# Patient Record
Sex: Male | Born: 1935 | Hispanic: Yes | Smoking: Never smoker
Health system: Southern US, Community
[De-identification: ages and names within clinical notes are randomized; demographics above are authoritative.]

## PROBLEM LIST (undated history)

## (undated) DIAGNOSIS — I10 Essential (primary) hypertension: Secondary | ICD-10-CM

## (undated) DIAGNOSIS — E079 Disorder of thyroid, unspecified: Secondary | ICD-10-CM

## (undated) HISTORY — DX: Disorder of thyroid, unspecified: E07.9

---

## 2009-04-09 ENCOUNTER — Emergency Department: Payer: Self-pay | Admitting: Emergency Medicine

## 2011-09-05 ENCOUNTER — Emergency Department: Payer: Self-pay | Admitting: Emergency Medicine

## 2014-06-30 ENCOUNTER — Observation Stay: Payer: Self-pay | Admitting: Internal Medicine

## 2014-06-30 LAB — CBC
HCT: 42.9 % (ref 40.0–52.0)
HGB: 14.1 g/dL (ref 13.0–18.0)
MCH: 29 pg (ref 26.0–34.0)
MCHC: 32.9 g/dL (ref 32.0–36.0)
MCV: 88 fL (ref 80–100)
Platelet: 243 10*3/uL (ref 150–440)
RBC: 4.86 10*6/uL (ref 4.40–5.90)
RDW: 15.2 % — ABNORMAL HIGH (ref 11.5–14.5)
WBC: 10.2 10*3/uL (ref 3.8–10.6)

## 2014-06-30 LAB — TROPONIN I
Troponin-I: <0.02 ng/mL
Troponin-I: <0.02 ng/mL
Troponin-I: <0.02 ng/mL

## 2014-06-30 LAB — COMPREHENSIVE METABOLIC PANEL
ALT: 24 U/L
AST: 27 U/L (ref 15–37)
Albumin: 3.4 g/dL (ref 3.4–5.0)
Alkaline Phosphatase: 82 U/L
Anion Gap: 9 (ref 7–16)
BILIRUBIN TOTAL: 1.2 mg/dL — AB (ref 0.2–1.0)
BUN: 19 mg/dL — ABNORMAL HIGH (ref 7–18)
CHLORIDE: 100 mmol/L (ref 98–107)
CO2: 25 mmol/L (ref 21–32)
Calcium, Total: 8.4 mg/dL — ABNORMAL LOW (ref 8.5–10.1)
Creatinine: 1.17 mg/dL (ref 0.60–1.30)
EGFR (African American): >60
GFR CALC NON AF AMER: 56 — AB
GLUCOSE: 89 mg/dL (ref 65–99)
Osmolality: 270 (ref 275–301)
POTASSIUM: 4.2 mmol/L (ref 3.5–5.1)
Sodium: 134 mmol/L — ABNORMAL LOW (ref 136–145)
Total Protein: 6.9 g/dL (ref 6.4–8.2)

## 2014-06-30 LAB — LIPID PANEL
Cholesterol: 120 mg/dL (ref 0–200)
HDL Cholesterol: 42 mg/dL (ref 40–60)
Ldl Cholesterol, Calc: 61 mg/dL (ref 0–100)
Triglycerides: 87 mg/dL (ref 0–200)
VLDL Cholesterol, Calc: 17 mg/dL (ref 5–40)

## 2014-06-30 LAB — CK TOTAL AND CKMB (NOT AT ARMC)
CK, Total: 80 U/L
CK, Total: 56 U/L
CK, Total: 55 U/L
CK-MB: 1.1 ng/mL (ref 0.5–3.6)
CK-MB: 1.2 ng/mL (ref 0.5–3.6)
CK-MB: 1 ng/mL (ref 0.5–3.6)

## 2014-06-30 LAB — PROTIME-INR
INR: 1.1
Prothrombin Time: 13.7 secs (ref 11.5–14.7)

## 2014-06-30 LAB — TSH: Thyroid Stimulating Horm: 9.21 u[IU]/mL — ABNORMAL HIGH

## 2015-02-17 NOTE — Discharge Summary (Signed)
PATIENT NAME:  Bruce PyleFUEL Zavala, Bruce Zavala MR#:  [id number] DATE OF BIRTH:  [birth year]  DATE OF ADMISSION:  06/30/2014 DATE OF DISCHARGE:  06/30/2014  DISCHARGE DIAGNOSES: 1.  Presyncope.  2.  Hypothyroidism.  3.  Hypertension.   DISCHARGE MEDICATIONS: 1.  Losartan 100 mg daily.  2.  Levothyroxine 125 mcg daily.  3.  Aspirin 81 mg daily.  4.  Advil 200 mg 2 tablets oral once a day as needed for pain.   DISCHARGE INSTRUCTIONS: Low-sodium diet. Activity as tolerated. Follow up with primary care physician in 1 to 2 weeks.   IMAGING STUDIES: Include a chest x-ray PA and lateral, which showed some atelectasis scarring, nothing acute.   ADMITTING HISTORY AND PHYSICAL: Please see detailed H and P dictated by Dr. Sheryle Hailiamond. In brief, an 81 year old male patient brought into the hospital complaining of presyncope. The patient also had significant diaphoresis, was admitted to the hospitalist service for further workup.   HOSPITAL COURSE: The patient was treated with IV fluids for his dehydration. He was also found to have elevated TSH. He was not sure if he was taking levothyroxine at home, but later mentioned that he was taking 75 mcg daily after meeting with the family. At this point, a possible diagnosis was that he got bradycardic causing his presyncope along with his dehydration. His levothyroxine is being increased. The patient is symptom-free, feels significantly better, although had fatigue secondary to the hypothyroidism. He will follow up with his primary care physician in a week. Cardiac enzymes were normal. Imaging studies turned back regular.   Prior to discharge, the patient is symptom free, has ambulated on his own. No pain and feels back to baseline.   TIME SPENT: On day of discharge in discharge activity was 42 minutes.    ____________________________ Molinda BailiffSrikar R. Loras Grieshop, MD srs:at D: 07/04/2014 12:57:48 ET T: 07/04/2014 13:12:15 ET JOB#: [id number]  cc: Wardell HeathSrikar R. Makiyah Zentz, MD, <Dictator> Orie FishermanSRIKAR  R Brithney Bensen MD ELECTRONICALLY SIGNED 07/15/2014 14:29

## 2015-02-17 NOTE — H&P (Signed)
PATIENT NAME:  Margaret PyleFUEL RUIZ, Raequan MR#:  [id number] DATE OF BIRTH:  03-Jun-1926  DATE OF ADMISSION:  06/30/2014  REFERRING PHYSICIAN: Dr. Sharyn CreamerMark Quale   PRIMARY CARE DOCTOR: Nonlocal.   ADMISSION DIAGNOSIS: Syncope and chest pain.   HISTORY OF PRESENT ILLNESS: This is an 81 year old Hispanic male who presents to the Emergency Department complaining of chest pain. The patient states it began while he was sitting in the barber shop. He felt very hot and somewhat faint. He took a seat and then began to actually feel somewhat cold. He never passed out. He also denies feeling shortness of breath, but admits to some nausea. After about 15 minutes his chest pain stopped as did his diaphoresis. When asked specifically where the pain was he points to his right neck and shoulder area. The pain was pressure in quality but he states it is now sore and feels like pinching when he moves his right arm. Due to his symptoms, the Emergency Department staff called for rule out of acute coronary syndrome.   REVIEW OF SYSTEMS:  CONSTITUTIONAL: The patient denies fever or weakness.  EYES: The patient denies blurred vision or inflammation.  EARS, NOSE AND THROAT:  The patient denies tinnitus or sore throat.  RESPIRATORY: The patient denies cough or shortness of breath.  CARDIOVASCULAR: The patient admits to chest pain, but denies dyspnea on exertion or palpitations.  GASTROINTESTINAL: The patient admits to nausea but denies vomiting, diarrhea or abdominal pain.  GENITOURINARY: The patient denies dysuria, increased hesitancy or frequency.  ENDOCRINE: The patient denies polyuria or nocturia.  HEMATOLOGIC AND LYMPHATIC: The patient denies bruising or bleeding.  INTEGUMENT: The patient denies rashes or lesions.  NEUROLOGIC: The patient denies weakness or dysarthria.  PSYCHIATRIC: The patient denies depression or suicidal ideation.   PAST MEDICAL HISTORY: Hypertension.   PAST SURGICAL HISTORY: None.   FAMILY HISTORY: The  patient is unaware of any cancers or chronic medical conditions that run in the family.   SOCIAL HISTORY: The patient does not smoke, drink or do any drugs. He lives with his family.   MEDICATIONS:  1. Losartan 100 mg 1 tab p.o. daily.  2. Levothyroxine 75 mcg 1 capsule p.o. daily.   ALLERGIES: PENICILLIN.  PERTINENT LABORATORY RESULTS AND RADIOGRAPHIC FINDINGS: BUN 19, creatinine 1.17, sodium 134, calcium is 8.4, serum albumin is 3.4, total bilirubin is 1.2. Troponin negative, thyroid stimulating hormone is 9.21. White blood cell count 10.2, hemoglobin is 14.1, hematocrit is 42.9.  INR is 1.1. Chest x-ray shows some linear bibasilar subsegmental atelectasis or scarring. It is otherwise negative.   PHYSICAL EXAMINATION:  VITAL SIGNS: Temperature 98.2, pulse 67, respirations 20, blood pressure 144/74. orthostatics lying 121/65, sitting 120/56, blood standing 124/69, pulse oximetry 98% on room air.  GENERAL: The patient is alert and oriented x 3 in no apparent distress.  HEENT: Normocephalic, atraumatic. Pupils equal, round, and reactive to light and accommodation. Extraocular movements are intact. Mucous membranes are moist.  NECK: Trachea is midline. No adenopathy.  CHEST: Symmetric and atraumatic.  CARDIOVASCULAR: Regular rate and rhythm. Normal S1, S2. No rubs, clicks, or murmurs.  LUNGS: Clear to auscultation bilaterally. Normal effort and excursion.  ABDOMEN: Positive bowel sounds, soft, nontender, nondistended. No hepatosplenomegaly.  GENITOURINARY: Deferred.  MUSCULOSKELETAL: The patient moves all 4 extremities equally.  SKIN: No rashes or lesions.  EXTREMITIES: No clubbing, cyanosis, or edema.  NEUROLOGIC: Cranial nerves II through XII grossly intact.  PSYCHIATRIC: Mood is normal. Affect is congruent.   ASSESSMENT AND PLAN: This  is an 81 year old man with presyncopal symptoms and chest pain.   1. Syncope. The patient did not actually lose consciousness. He felt weak and took a  seat before he lost consciousness and he has memory of the whole incident. He was not orthostatic. He is mildly dehydrated and we have him on some intravenous fluid at this time. We will continue to monitor for any lightheadedness or dizziness.  2. Chest pain. Initially seems musculoskeletal as the patient points to his right trapezius muscle as the source of pain. The pain is reproducible. However, the constellation of symptoms are frequently found with symptoms of coronary artery disease. Thus, we will continue to cycle the patient's cardiac enzymes all of which are negative at this point. He does not have any signs of acute ischemic changes on his EKG. I suspect that perhaps his symptoms were due to tacky arrhythmia as it appears the patient is severely hypothyroid. Indeed, he mentioned to me that he only takes Losartan, yet his pharmacy reconciliation shows that she should be on Synthroid, which he clearly is not taking based on the stimulating hormone result. We will restart the patient on his thyroid medication.  3. Hypertension. Continue Losartan.   4. Deep vein thrombosis prophylaxis. Sequential compression devices.  5. Gastrointestinal prophylaxis. Proton pump inhibitor.   CODE STATUS: The patient is a full code.   TIME SPENT ON ADMISSION ORDERS AND PATIENT CARE: Approximately 35 minutes.      ____________________________ Kelton Pillar. Sheryle Hail, MD msd:JT D: 06/30/2014 05:35:34 ET T: 06/30/2014 06:37:12 ET JOB#: [id number]  cc: Kelton Pillar. Sheryle Hail, MD, <Dictator> Kelton Pillar Bronsen Serano MD ELECTRONICALLY SIGNED 07/01/2014 7:33

## 2015-06-28 ENCOUNTER — Other Ambulatory Visit: Payer: Self-pay

## 2015-06-28 ENCOUNTER — Encounter: Payer: Self-pay | Admitting: Medical Oncology

## 2015-06-28 ENCOUNTER — Emergency Department: Payer: Medicaid Other

## 2015-06-28 ENCOUNTER — Emergency Department
Admission: EM | Admit: 2015-06-28 | Discharge: 2015-06-28 | Disposition: A | Discharge status: Home / Self Care | Payer: Medicaid Other | Attending: Emergency Medicine | Admitting: Emergency Medicine

## 2015-06-28 DIAGNOSIS — Z880 Allergy status to penicillin: Secondary | ICD-10-CM | POA: Insufficient documentation

## 2015-06-28 DIAGNOSIS — I10 Essential (primary) hypertension: Secondary | ICD-10-CM | POA: Insufficient documentation

## 2015-06-28 DIAGNOSIS — B029 Zoster without complications: Secondary | ICD-10-CM | POA: Diagnosis not present

## 2015-06-28 DIAGNOSIS — R079 Chest pain, unspecified: Secondary | ICD-10-CM | POA: Insufficient documentation

## 2015-06-28 HISTORY — DX: Essential (primary) hypertension: I10

## 2015-06-28 LAB — TROPONIN I: Troponin I: <0.03 ng/mL (ref <0.031)

## 2015-06-28 LAB — CBC
HEMATOCRIT: 44.3 % (ref 40.0–52.0)
HEMOGLOBIN: 14.8 g/dL (ref 13.0–18.0)
MCH: 27.2 pg (ref 26.0–34.0)
MCHC: 33.4 g/dL (ref 32.0–36.0)
MCV: 81.5 fL (ref 80.0–100.0)
Platelets: 237 10*3/uL (ref 150–440)
RBC: 5.44 MIL/uL (ref 4.40–5.90)
RDW: 19.1 % — ABNORMAL HIGH (ref 11.5–14.5)
WBC: 6.4 10*3/uL (ref 3.8–10.6)

## 2015-06-28 LAB — BASIC METABOLIC PANEL
ANION GAP: 8 (ref 5–15)
BUN: 21 mg/dL — ABNORMAL HIGH (ref 6–20)
CALCIUM: 8.8 mg/dL — AB (ref 8.9–10.3)
CO2: 23 mmol/L (ref 22–32)
Chloride: 104 mmol/L (ref 101–111)
Creatinine, Ser: 0.99 mg/dL (ref 0.61–1.24)
GFR calc Af Amer: >60 mL/min (ref >60)
GFR calc non Af Amer: >60 mL/min (ref >60)
GLUCOSE: 110 mg/dL — AB (ref 65–99)
POTASSIUM: 4.3 mmol/L (ref 3.5–5.1)
Sodium: 135 mmol/L (ref 135–145)

## 2015-06-28 MED ORDER — NITROGLYCERIN 0.4 MG SL SUBL
0.4000 mg | SUBLINGUAL_TABLET | Freq: Once | SUBLINGUAL | Status: AC
  Administered 2015-06-28: 0.4 mg via SUBLINGUAL
  Filled 2015-06-28: qty 1

## 2015-06-28 MED ORDER — IBUPROFEN 600 MG PO TABS: 600.0000 mg | ORAL_TABLET | Freq: Three times a day (TID) | ORAL | Status: DC | PRN

## 2015-06-28 MED ORDER — OXYCODONE-ACETAMINOPHEN 5-325 MG PO TABS: 1.0000 | ORAL_TABLET | Freq: Four times a day (QID) | ORAL | Status: DC | PRN

## 2015-06-28 MED ORDER — METHYLPREDNISOLONE 4 MG PO TBPK: ORAL_TABLET | ORAL | Status: DC

## 2015-06-28 MED ORDER — ASPIRIN 81 MG PO CHEW
324.0000 mg | CHEWABLE_TABLET | Freq: Once | ORAL | Status: AC
  Administered 2015-06-28: 324 mg via ORAL
  Filled 2015-06-28: qty 4

## 2015-06-28 MED ORDER — VALACYCLOVIR HCL 1 G PO TABS: 2000.0000 mg | ORAL_TABLET | Freq: Three times a day (TID) | ORAL | Status: AC

## 2015-06-28 NOTE — ED Notes (Signed)
Pt with granddaughter to triage with reports that pt began having central chest pain that worsened today. Pt reports that the pain has began to radiate into back and into left arm.

## 2015-06-28 NOTE — ED Provider Notes (Addendum)
[facility] Emergency Department Provider Note     Time seen: ----------------------------------------- 2:03 PM on 06/28/2015 -----------------------------------------    I have reviewed the triage vital signs and the nursing notes.   HISTORY  Chief Complaint Chest Pain    HPI Bruce Zavala is a 81 y.o. male who began having chest pain that has worsened today. It is sharp and intermittent. Nothing seems to make it better or worse, was concerning and the point where he wanted to seek treatment for today. Patient states most of the left side of his chest and his left axilla. Patient denies any other complaints.   Past Medical History  Diagnosis Date  . Hypertension     There are no active problems to display for this patient.   No past surgical history on file.  Allergies Penicillins  Social History Social History  Substance Use Topics  . Smoking status: Not on file  . Smokeless tobacco: Not on file  . Alcohol Use: Not on file    Review of Systems Constitutional: Negative for fever. Eyes: Negative for visual changes. ENT: Negative for sore throat. Cardiovascular: Positive for chest pain Respiratory: Negative for shortness of breath. Gastrointestinal: Negative for abdominal pain, vomiting and diarrhea. Genitourinary: Negative for dysuria. Musculoskeletal: Negative for back pain. Skin: Negative for rash. Neurological: Negative for headaches, focal weakness or numbness.  10-point ROS otherwise negative.  ____________________________________________   PHYSICAL EXAM:  VITAL SIGNS: ED Triage Vitals  Enc Vitals Group     BP 06/28/15 1142 141/58 mmHg     Pulse Rate 06/28/15 1142 65     Resp 06/28/15 1142 20     Temp 06/28/15 1142 98.1 F (36.7 C)     Temp Source 06/28/15 1142 Oral     SpO2 06/28/15 1142 94 %     Weight 06/28/15 1142 175 lb (79.379 kg)     Height 06/28/15 1142 5' 6.14" (1.68 m)     Head Cir --      Peak Flow  --      Pain Score 06/28/15 1144 5     Pain Loc --      Pain Edu? --      Excl. in GC? --     Constitutional: Alert and oriented. Well appearing and in no distress. Eyes: Conjunctivae are normal. PERRL. Normal extraocular movements. ENT   Head: Normocephalic and atraumatic.   Nose: No congestion/rhinnorhea.   Mouth/Throat: Mucous membranes are moist.   Neck: No stridor. Cardiovascular: Normal rate, regular rhythm. Normal and symmetric distal pulses are present in all extremities. No murmurs, rubs, or gallops. Patient with left parasternal tenderness that is reproducible in the exact pain that the patient is describing Respiratory: Normal respiratory effort without tachypnea nor retractions. Breath sounds are clear and equal bilaterally. No wheezes/rales/rhonchi. Gastrointestinal: Soft and nontender. No distention. No abdominal bruits.  Musculoskeletal: Nontender with normal range of motion in all extremities. No joint effusions.  No lower extremity tenderness nor edema. Neurologic:  Normal speech and language. No gross focal neurologic deficits are appreciated. Speech is normal. No gait instability. Skin:  Skin is warm, dry and intact. No rash noted. Psychiatric: Mood and affect are normal. Speech and behavior are normal. Patient exhibits appropriate insight and judgment. ____________________________________________  EKG: Interpreted by me. Normal sinus rhythm with low voltage, normal axis normal intervals. No evidence of hypertrophy or acute infarction  ____________________________________________  ED COURSE:  Pertinent labs & imaging results that were available during my care of  the patient were reviewed by me and considered in my medical decision making (see chart for details). We'll check cardiac labs due to the patient's age. Pain seems to be musculoskeletal in origin. ____________________________________________    LABS (pertinent positives/negatives)  Labs  Reviewed  BASIC METABOLIC PANEL - Abnormal; Notable for the following:    Glucose, Bld 110 (*)    BUN 21 (*)    Calcium 8.8 (*)    All other components within normal limits  CBC - Abnormal; Notable for the following:    RDW 19.1 (*)    All other components within normal limits  TROPONIN I  TROPONIN I    RADIOLOGY Chest x-ray Unremarkable  ____________________________________________  FINAL ASSESSMENT AND PLAN  Shingles, Chest pain  Plan: Patient with labs and imaging as dictated above. Pain seems to be costochondritis. He'll be given low-dose Motrin and encouraged to have close follow-up with his doctor here. Troponin is negative here 2.  Upon discharging the patient, a rash has developed in the interim between the time of his arrival in the time of his discharge. Rash is in a T1 distribution, vesicular with grouped lesions consistent with shingles. He'll be discharged with prednisone, pain medicine and valacyclovir.  Emily Filbert, MD   Emily Filbert, MD 06/28/15 1653  Emily Filbert, MD 06/28/15 [phone]

## 2015-06-28 NOTE — Discharge Instructions (Signed)
Costocondritis (Costochondritis) La costocondritis a veces llamada sndrome de [place] es la hinchazn e irritacin (inflamacin) del tejido Building surveyor) que une las costillas con el (esternn). Esto causa dolor en el pecho y la zona de las [place]. La costocondritis generalmente desaparece por s misma con el tiempo. Podr tardar Elaina Hoops 6 semanas en curarse o ms, especialmente si no puede restringir sus actividades. CAUSAS  Algunos casos de costocondritis no tienen causa conocida. Las causas posibles son:  [place] (traumatismos).  Ejercicios o actividades relacionadas con [employer].  Tos intensa. SIGNOS Y SNTOMAS  Dolor y sensibilidad en el rea de las [place].  Dolor que empeora al toser o respirar profundamente.  Dolor que empeora con movimientos especficos. DIAGNSTICO  El mdico le preguntar acerca de sus sntomas y le har un examen fsico. Podr indicarle radiografas para descartar otros problemas. TRATAMIENTO  La costocondritis generalmente desaparece por s misma con el tiempo. El mdico podr recetar algunos medicamentos para Primary school teacher. INSTRUCCIONES PARA EL CUIDADO EN EL HOGAR   Evite la actividad fsica extenuante. Trate de no esforzar las [employer] actividades habituales. Aqu se incluyen las [address] en las que [place] los msculos del pecho, abdominales y los msculos laterales, especialmente si debe levantar peso.  Aplique hielo en la zona afectada durante los primeros 2 das despus del inicio del dolor.  Ponga el hielo en una bolsa plstica.  Colquese una toalla entre la piel y la bolsa de hielo.  Deje el hielo durante 20 minutos, y aplquelo 2-3 veces por Futures trader.  Tome slo medicamentos de venta libre o recetados, segn las indicaciones del mdico. SOLICITE ATENCIN MDICA SI:  Tiene hinchazn o irritacin en las articulaciones de las costillas. Estos son signos de infeccin.  El dolor no desaparece aunque haga reposo o tome  medicamentos para Chief Technology Officer. SOLICITE ATENCIN MDICA DE INMEDIATO SI:   El dolor aumenta o siente muchas molestias.  Le falta el aire o tiene dificultad para respirar.  Tose y escupe sangre.  Siente falta de aire o dolor en el pecho, sudoracin o vmitos.  Tiene fiebre o sntomas persistentes durante ms de 2 - 3 das.  Tiene fiebre y los sntomas empeoran repentinamente. ASEGRESE DE QUE:   Comprende estas instrucciones.  Controlar su afeccin.  Recibir ayuda de inmediato si no mejora o si empeora. Document Released: 07/23/2005 Document Revised: 08/03/2013 [facility] Patient Information 2015 [place], [place]. This information is not intended to replace advice given to you by your health care provider. Make sure you discuss any questions you have with your health care provider. Culebrilla (Shingles) La culebrilla (herpes zoster) es una infeccin causada por el mismo virus que causa la varicela. La infeccin causa una erupcin y ampollas dolorosas llenas de lquido en la piel, las cuales se abren, forman [place] y [place] se curan. Puede ocurrir en cualquier parte del cuerpo, pero por lo general afecta solo un lado del cuerpo o del rostro. El dolor de la culebrilla suele durar alrededor de 1 mes. Sin embargo, International aid/development worker con culebrilla pueden sufrir dolor de larga duracin (crnico) en la zona del cuerpo afectada. La culebrilla suele ocurrir muchos aos despus de que la persona tuvo varicela. Es ms frecuente:  En Financial planner de 50 aos.  En los que tienen el sistema inmunolgico debilitado, como en los enfermos con VIH, sida o cncer.  En pacientes que toman medicamentos que debilitan el sistema inmunolgico, como los medicamentos que se indican en caso de transplantes.  En personas sometidas a un  gran estrs. CAUSAS  La causa de la culebrilla es el virus varicela zoster (VZV), que tambin causa la varicela. Despus de que una persona se infecta con el virus, Community education officer en su cuerpo durante aos en un estado inactivo (latente). Para causar la culebrilla, el virus se reactiva e irrumpe como una infeccin en una raz nerviosa. El virus se puede transmitir de persona a Social worker (es Armed forces technical officer) a Games developer del Pharmacologist con las ampollas abiertas de la erupcin que causa la culebrilla. Solo se contagiar a las personas que no han padecido varicela. Cuando estas personas tienen exposicin al virus, pueden sufrir varicela. No van a desarrollar culebrilla. Una vez que las ampollas cicatrizan, la persona ya no [place] y no puede transmitir el virus a Economist. SIGNOS Y SNTOMAS  La culebrilla aparece en etapas. Los sntomas iniciales pueden ser dolor, picazn y sensacin de hormigueo en un rea de la piel. Este dolor se describe generalmente como ardor, punzante o pulstil. En unos pocos das o semanas aparece una erupcin roja dolorosa en el rea donde se sinti dolor, picazn y hormigueo. La erupcin generalmente aparece en un lado del cuerpo en un patrn de bandas o semejante a una correa. Luego la erupcin por lo general se convierte en un grupo de ampollas llenas de lquido. Estas ampollas forman Deno Lunger y se secan en aproximadamente 2 a 3 semanas. Con los sntomas iniciales, la erupcin o las ampollas tambin puede haber sntomas similares a la gripe. Estos pueden incluir:  Grant Ruts.  Escalofros.  Dolor de [place].  Malestar estomacal. DIAGNSTICO  El mdico le har un examen de la piel para diagnosticar la [place]. Tambin podrn tomarle muestras de piel o de fluido de las ampollas. Esta muestra se examina bajo el microscopio o se enva al laboratorio para su anlisis posterior. TRATAMIENTO  No existe un tratamiento especfico para la culebrilla. El mdico probablemente le recete medicamentos para ayudarlo a Human resources officer, a recuperarse ms rpido y a Physiological scientist a Air cabin crew. Puede incluir medicamentos antivirales, anti-inflamatorios y  analgsicos. INSTRUCCIONES PARA EL CUIDADO EN EL HOGAR   Tome un bao de agua fra o aplique compresas fras en el rea de la erupcin o ampollas segn lo indicado. Esto disminuir el dolor y Higher education careers adviser.  Tome los medicamentos solamente como se lo haya indicado el mdico.  Haga reposo segn las indicaciones del mdico.  Mantenga la erupcin y las ampollas limpias con jabn suave y agua fresca o como se lo indique el mdico.  No pellizque las ampollas ni se rasque en la zona de la erupcin. Aplique una crema para calmar la picazn o cremas anestsicas en el rea afectada segn las indicaciones del mdico.  Mantenga la erupcin cubierta con un vendaje suelto (apsito).  Evite el contacto con:  Bebs.  Embarazadas.  Nios con eczema.  Personas mayores que han recibido un transplante.  Personas con enfermedades crnicas, como leucemia y sida.  Use ropa holgada para ayudar a Engineer, materials del roce con la erupcin.  Concurra a todas las visitas de control como se lo haya indicado el mdico. Si la zona afectada est en el rostro, podrn derivarlo a un especialista, como un mdico especialista en ojos (oftalmlogo) o en odos, nariz y Advertising copywriter (otorrinolaringlogo). Concurrir a todas las visitas de control lo ayudar a Environmental health practitioner en los ojos, dolor crnico y discapacidad. SOLICITE ATENCIN MDICA DE INMEDIATO SI:   Tiene dolor en el rostro, en la zona de los ojos  o tiene prdida de sensibilidad en un lado del rostro.  Siente dolor o zumbido en el odo.  Tiene prdida del gusto.  El dolor no se alivia con los medicamentos prescritos.  La irritacin o la hinchazn se extienden.  Tiene ms dolor e inflamacin.  La afeccin empeora o ha [place].  Tiene fiebre. ASEGRESE DE QUE:  Comprende estas instrucciones.  Controlar su afeccin.  Recibir ayuda de inmediato si no mejora o si empeora. Document Released: 07/23/2005 Document Revised: 02/27/2014 [facility]  Patient Information 2015 [place], [place]. This information is not intended to replace advice given to you by your health care provider. Make sure you discuss any questions you have with your health care provider.

## 2015-06-28 NOTE — ED Notes (Signed)
Interpreter at bedside to explain medications.

## 2016-07-15 ENCOUNTER — Ambulatory Visit: Payer: Medicaid Other | Attending: Physician Assistant | Admitting: Physical Therapy

## 2016-07-15 DIAGNOSIS — M5441 Lumbago with sciatica, right side: Secondary | ICD-10-CM | POA: Insufficient documentation

## 2016-07-25 NOTE — Therapy (Signed)
[facility] [address] [place], [place], [address] Phone: [phone]   Fax:  [phone]  Physical Therapy Evaluation  Patient Details  Name: Bruce PyleHector Fuel Zavala MRN: [id number] Date of Birth: [birth year] Referring Provider: Eustace MooreWilkstrom  Encounter Date: 07/15/2016    Past Medical History:  Diagnosis Date  . Hypertension     No past surgical history on file.  There were no vitals filed for this visit.                                   PT Long Term Goals - 07/15/16 1306      PT LONG TERM GOAL #1   Title Pt will be I with basic DSE exercise routine to address pain.   Baseline Instructed pt during eval in this   Time 1   Period Days   Status New             Patient will benefit from skilled therapeutic intervention in order to improve the following deficits and impairments:  Decreased strength, Pain, Difficulty walking, Decreased activity tolerance, Impaired flexibility  Visit Diagnosis: Right-sided low back pain with right-sided sciatica - Plan: PT plan of care cert/re-cert     Problem List There are no active problems to display for this patient.   Fisher,Benjamin 07/25/2016, 11:11 AM  [facility] [address] [place], [place], [address] Phone: [phone]   Fax:  [phone]  Name: Bruce PyleHector Fuel Zavala MRN: [id number] Date of Birth: [birth year]

## 2017-08-05 ENCOUNTER — Ambulatory Visit: Payer: Self-pay | Admitting: Family Medicine

## 2017-08-07 ENCOUNTER — Ambulatory Visit: Payer: Self-pay | Admitting: Family Medicine

## 2020-06-27 ENCOUNTER — Other Ambulatory Visit: Payer: Self-pay

## 2021-09-17 ENCOUNTER — Other Ambulatory Visit: Payer: Self-pay

## 2021-09-17 ENCOUNTER — Encounter: Payer: Self-pay | Admitting: Emergency Medicine

## 2021-09-17 ENCOUNTER — Emergency Department
Admission: EM | Admit: 2021-09-17 | Discharge: 2021-09-18 | Disposition: A | Discharge status: Home / Self Care | Payer: Medicaid Other | Attending: Emergency Medicine | Admitting: Emergency Medicine

## 2021-09-17 ENCOUNTER — Emergency Department: Payer: Medicaid Other

## 2021-09-17 DIAGNOSIS — Z79899 Other long term (current) drug therapy: Secondary | ICD-10-CM | POA: Insufficient documentation

## 2021-09-17 DIAGNOSIS — R791 Abnormal coagulation profile: Secondary | ICD-10-CM | POA: Diagnosis not present

## 2021-09-17 DIAGNOSIS — I10 Essential (primary) hypertension: Secondary | ICD-10-CM | POA: Diagnosis not present

## 2021-09-17 DIAGNOSIS — H532 Diplopia: Secondary | ICD-10-CM | POA: Insufficient documentation

## 2021-09-17 LAB — COMPREHENSIVE METABOLIC PANEL
ALT: 15 U/L (ref 0–44)
AST: 20 U/L (ref 15–41)
Albumin: 3.9 g/dL (ref 3.5–5.0)
Alkaline Phosphatase: 80 U/L (ref 38–126)
Anion gap: 4 — ABNORMAL LOW (ref 5–15)
BUN: 24 mg/dL — ABNORMAL HIGH (ref 8–23)
CO2: 29 mmol/L (ref 22–32)
Calcium: 8.9 mg/dL (ref 8.9–10.3)
Chloride: 103 mmol/L (ref 98–111)
Creatinine, Ser: 0.84 mg/dL (ref 0.61–1.24)
GFR, Estimated: >60 mL/min (ref >60)
Glucose, Bld: 108 mg/dL — ABNORMAL HIGH (ref 70–99)
Potassium: 3.8 mmol/L (ref 3.5–5.1)
Sodium: 136 mmol/L (ref 135–145)
Total Bilirubin: 1.5 mg/dL — ABNORMAL HIGH (ref 0.3–1.2)
Total Protein: 7.6 g/dL (ref 6.5–8.1)

## 2021-09-17 LAB — DIFFERENTIAL
Abs Immature Granulocytes: 0.04 10*3/uL (ref 0.00–0.07)
Basophils Absolute: 0.1 10*3/uL (ref 0.0–0.1)
Basophils Relative: 1 %
Eosinophils Absolute: 0.2 10*3/uL (ref 0.0–0.5)
Eosinophils Relative: 3 %
Immature Granulocytes: 1 %
Lymphocytes Relative: 27 %
Lymphs Abs: 1.4 10*3/uL (ref 0.7–4.0)
Monocytes Absolute: 0.5 10*3/uL (ref 0.1–1.0)
Monocytes Relative: 10 %
Neutro Abs: 3.1 10*3/uL (ref 1.7–7.7)
Neutrophils Relative %: 58 %

## 2021-09-17 LAB — PROTIME-INR
INR: 1.2 (ref 0.8–1.2)
Prothrombin Time: 14.8 seconds (ref 11.4–15.2)

## 2021-09-17 LAB — CBC
HCT: 45.5 % (ref 39.0–52.0)
Hemoglobin: 15.5 g/dL (ref 13.0–17.0)
MCH: 29.8 pg (ref 26.0–34.0)
MCHC: 34.1 g/dL (ref 30.0–36.0)
MCV: 87.5 fL (ref 80.0–100.0)
Platelets: 237 10*3/uL (ref 150–400)
RBC: 5.2 MIL/uL (ref 4.22–5.81)
RDW: 15.4 % (ref 11.5–15.5)
WBC: 5.3 10*3/uL (ref 4.0–10.5)
nRBC: 0 % (ref 0.0–0.2)

## 2021-09-17 LAB — TROPONIN I (HIGH SENSITIVITY): Troponin I (High Sensitivity): 11 ng/L (ref <18)

## 2021-09-17 LAB — APTT: aPTT: 27 seconds (ref 24–36)

## 2021-09-17 MED ORDER — SODIUM CHLORIDE 0.9% FLUSH: 3.0000 mL | Freq: Once | INTRAVENOUS | Status: DC

## 2021-09-17 NOTE — ED Provider Notes (Signed)
[facility] Emergency Department Provider Note   ____________________________________________   Event Date/Time   First MD Initiated Contact with Patient 09/17/21 2349     (approximate)  I have reviewed the triage vital signs and the nursing notes.   HISTORY  Chief Complaint Hypertension  History obtained via patient's daughter  HPI Bruce Zavala Fenton Malling is a 86 y.o. male referred to the ED by [facility] for further evaluation of hypertension and diplopia.  Patient with a history of hypertension on losartan who reports a several day history of diplopia.  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  Less likely GCA.  Patient was told to follow-up with his doctor for blood pressure and cholesterol testing.  His PCP is at [facility] so he [facility] to [facility] who then referred him to the ED.  Denies associated headache, neck pain, slurred speech, facial droop, extremity weakness/numbness/tingling.  Ambulates with a cane.  Denies recent fever, cough, chest pain, shortness of breath, abdominal pain, nausea, vomiting or dizziness.      Past Medical History:  Diagnosis Date   Hypertension     There are no problems to display for this patient.   History reviewed. No pertinent surgical history.  Prior to Admission medications   Medication Sig Start Date End Date Taking? Authorizing Provider  hydrochlorothiazide (HYDRODIURIL) 12.5 MG tablet Take 1 tablet (12.5 mg total) by mouth daily. 09/18/21  Yes Irean Hong, MD  aspirin EC 81 MG tablet Take 100 mg by mouth daily.    [provider]  ibuprofen (ADVIL,MOTRIN) 600 MG tablet Take 1 tablet (600 mg total) by mouth every 8 (eight) hours as needed. 06/28/15   Emily Filbert, MD  methylPREDNISolone (MEDROL DOSEPAK) 4 MG TBPK tablet Take one pak as directed 06/28/15   Emily Filbert, MD  oxyCODONE-acetaminophen (ROXICET) 5-325 MG per tablet Take 1 tablet by mouth every 6 (six) hours as needed. 06/28/15   Emily Filbert, MD    Allergies Penicillins  No family history on file.  Social History    Review of Systems  Constitutional: No fever/chills Eyes: Positive for visual changes. ENT: No sore throat. Cardiovascular: Denies chest pain. Respiratory: Denies shortness of breath. Gastrointestinal: No abdominal pain.  No nausea, no vomiting.  No diarrhea.  No constipation. Genitourinary: Negative for dysuria. Musculoskeletal: Negative for back pain. Skin: Negative for rash. Neurological: Negative for headaches, focal weakness or numbness.   ____________________________________________   PHYSICAL EXAM:  VITAL SIGNS: ED Triage Vitals  Enc Vitals Group     BP 09/17/21 1747 (!) 156/61     Pulse Rate 09/17/21 1747 71     Resp 09/17/21 1747 16     Temp 09/17/21 1747 97.8 F (36.6 C)     Temp Source 09/17/21 1747 Oral     SpO2 09/17/21 1747 94 %     Weight 09/17/21 1615 175 lb 0.7 oz (79.4 kg)     Height 09/17/21 1615 5' 6.14" (1.68 m)     Head Circumference --      Peak Flow --      Pain Score 09/17/21 1615 0     Pain Loc --      Pain Edu? --      Excl. in GC? --     Constitutional: Alert and oriented.  Elderly appearing and in no acute  distress. Eyes: Conjunctivae are normal. PERRL.  Left 6th nerve palsy.  Mild bilateral upper ptosis. Head: Atraumatic. Nose: No congestion/rhinnorhea. Mouth/Throat: Mucous membranes are mildly dry.   Neck: No stridor.   Cardiovascular: Normal rate, regular rhythm. Grossly normal heart sounds.  Good peripheral circulation. Respiratory: Normal respiratory effort.  No retractions. Lungs CTAB. Gastrointestinal: Soft and nontender to light or deep palpation. No distention. No abdominal bruits. No CVA tenderness. Musculoskeletal: No lower extremity tenderness nor edema.  No joint effusions. Neurologic:  Alert and oriented x3.  CN II to XII grossly intact. Normal speech and language. No gross focal neurologic deficits are appreciated. No gait instability. Skin:  Skin is warm, dry and intact. No rash noted.  No petechiae. Psychiatric: Mood and affect are normal. Speech and behavior are normal.  ____________________________________________   LABS (all labs ordered are listed, but only abnormal results are displayed)  Labs Reviewed  COMPREHENSIVE METABOLIC PANEL - Abnormal; Notable for the following components:      Result Value   Glucose, Bld 108 (*)    BUN 24 (*)    Total Bilirubin 1.5 (*)    Anion gap 4 (*)    All other components within normal limits  URINALYSIS, COMPLETE (UACMP) WITH MICROSCOPIC - Abnormal; Notable for the following components:   Color, Urine YELLOW (*)    APPearance CLEAR (*)    All other components within normal limits  T4, FREE - Abnormal; Notable for the following components:   Free T4 1.20 (*)    All other components within normal limits  PROTIME-INR  APTT  CBC  DIFFERENTIAL  TSH  SEDIMENTATION RATE  LIPID PANEL  C-REACTIVE PROTEIN  TROPONIN I (HIGH SENSITIVITY)   ____________________________________________  EKG  ED ECG REPORT I, Cherilyn Sautter J, the attending physician, personally viewed and interpreted this ECG.   Date: 09/18/2021  EKG Time: 1751  Rate: 67  Rhythm: normal sinus rhythm  Axis: Normal  Intervals:none  ST&T Change: Nonspecific  ____________________________________________  RADIOLOGY I, Saran Laviolette J, personally viewed and evaluated these images (plain radiographs) as part of my medical decision making, as well as reviewing the written report by the radiologist.  ED MD interpretation: No ICH; signs of chronic microvascular ischemic changes; unremarkable brain MRI  Official radiology report(s): CT HEAD WO CONTRAST  Result Date: 09/17/2021 CLINICAL DATA:  A 87 year old male presents with reported neuro deficit, double vision  for 2 days in the setting of elevated blood pressure. EXAM: CT HEAD WITHOUT CONTRAST TECHNIQUE: Contiguous axial images were obtained from the base of the skull through the vertex without intravenous contrast. COMPARISON:  None FINDINGS: Brain: No evidence of acute infarction, hemorrhage, hydrocephalus, extra-axial collection or mass lesion/mass effect. Signs of atrophy and chronic microvascular ischemic changes. Vascular: No hyperdense vessel or unexpected calcification. Skull: Normal. Negative for fracture or focal lesion. Sinuses/Orbits: Visualized paranasal sinuses and orbits without acute process. Other: None. IMPRESSION: No acute intracranial abnormality. Signs of atrophy and chronic microvascular ischemic changes. Electronically Signed   By: Zetta Bills M.D.   On: 09/17/2021 16:44   MR BRAIN WO CONTRAST  Result Date: 09/18/2021 CLINICAL DATA:  Memory loss, double vision EXAM: MRI HEAD WITHOUT CONTRAST TECHNIQUE: Multiplanar, multiecho pulse sequences of the brain and surrounding structures were obtained without intravenous contrast. COMPARISON:  No prior MRI, correlation is made with CT 09/17/2021 FINDINGS: Brain: No restricted diffusion to suggest acute or subacute infarction. No acute hemorrhage, mass, mass effect, or midline shift. Cerebral atrophy is within normal limits for age; no  lobar predominance. No hydrocephalus or extra-axial collection. Scattered T2 hyperintense signal in the periventricular white matter, likely the sequela of mild chronic small vessel ischemic disease. No foci of hemosiderin deposition to suggest remote hemorrhage or superficial siderosis. Vascular: Normal flow voids. Skull and upper cervical spine: Normal marrow signal. Sinuses/Orbits: Negative.  Status post bilateral lens replacements. Other: The mastoids are well aerated. IMPRESSION: No acute intracranial process. No etiology is seen for the patient's memory loss or double vision. Electronically Signed   By: Merilyn Baba M.D.   On: 09/18/2021 02:51    ____________________________________________   PROCEDURES  Procedure(s) performed (including Critical Care):  Procedures   ____________________________________________   INITIAL IMPRESSION / ASSESSMENT AND PLAN / ED COURSE  As part of my medical decision making, I reviewed the following data within the [place] History obtained from family (daughter), Nursing notes reviewed and incorporated, Labs reviewed, EKG interpreted, Old chart reviewed (09/17/2021 [facility] ophthalmology notes and 09/17/2021 [facility] note), Radiograph reviewed, and Notes from prior ED visits     87 year old male referred to the ED for further evaluation of diplopia and hypertension.  Differential diagnosis includes but is not limited to TIA, CVA, aneurysm, tumor, ACS, infectious, metabolic etiologies, etc.  Laboratory results unremarkable.  Will check thyroid panel, inflammatory markers.  Obtain MRI brain.  Blood pressure currently 135/63.  Will reassess.  Clinical Course as of 09/18/21 0636  Wed Sep 18, 2021  0312 Unremarkable MRI.  Looking back at patient's clinic visits from 2021, looks like his blood pressure was in the 160s/80s range.  He is already on losartan 100 mg.  We will add very low-dose 12.5 mg HCTZ, have patient's daughter monitor his blood pressure and follow-up closely with his PCP.  Inflammatory markers and lipid panel outstanding.  I have encouraged them to follow-up on those results via MyChart.  Strict return precautions given.  Both verbalized understanding and agree with plan of care. [JS]    Clinical Course User Index [JS] Paulette Blanch, MD     ____________________________________________   FINAL CLINICAL IMPRESSION(S) / ED DIAGNOSES  Final diagnoses:  Hypertension, unspecified type  Diplopia     ED Discharge Orders          Ordered    hydrochlorothiazide (HYDRODIURIL) 12.5 MG tablet  Daily        09/18/21 0315              Note:  This document was prepared using Dragon voice recognition software and may include unintentional dictation errors.    Paulette Blanch, MD 09/18/21 [phone]

## 2021-09-17 NOTE — ED Triage Notes (Signed)
First Nurse Note:  C/O elevated BP and double vision x 2 days.  Seen by Optho already, referred To Ed for further evaluation.

## 2021-09-18 ENCOUNTER — Emergency Department: Payer: Medicaid Other

## 2021-09-18 LAB — LIPID PANEL
Cholesterol: 153 mg/dL (ref 0–200)
HDL: 46 mg/dL (ref >40)
LDL Cholesterol: 88 mg/dL (ref 0–99)
Total CHOL/HDL Ratio: 3.3 RATIO
Triglycerides: 94 mg/dL (ref <150)
VLDL: 19 mg/dL (ref 0–40)

## 2021-09-18 LAB — C-REACTIVE PROTEIN: CRP: 0.5 mg/dL (ref <1.0)

## 2021-09-18 LAB — TSH: TSH: 1.838 u[IU]/mL (ref 0.350–4.500)

## 2021-09-18 LAB — URINALYSIS, COMPLETE (UACMP) WITH MICROSCOPIC
Bacteria, UA: NONE SEEN
Bilirubin Urine: NEGATIVE
Glucose, UA: NEGATIVE mg/dL
Hgb urine dipstick: NEGATIVE
Ketones, ur: NEGATIVE mg/dL
Leukocytes,Ua: NEGATIVE
Nitrite: NEGATIVE
Protein, ur: NEGATIVE mg/dL
Specific Gravity, Urine: 1.018 (ref 1.005–1.030)
pH: 5 (ref 5.0–8.0)

## 2021-09-18 LAB — SEDIMENTATION RATE: Sed Rate: 7 mm/hr (ref 0–20)

## 2021-09-18 LAB — T4, FREE: Free T4: 1.2 ng/dL — ABNORMAL HIGH (ref 0.61–1.12)

## 2021-09-18 MED ORDER — HYDROCHLOROTHIAZIDE 12.5 MG PO TABS: 12.5000 mg | ORAL_TABLET | Freq: Every day | ORAL | 0 refills | Status: DC

## 2021-09-18 NOTE — ED Notes (Signed)
E-signature pad unavailable - Pt & Daughter verbalized understanding of D/C information - no additional concerns at this time.  

## 2021-09-18 NOTE — Discharge Instructions (Signed)
1.  Start HCTZ 12.5 mg daily in addition to your other blood pressure medicine. 2.  Keep a log of your blood pressures and take these to your doctor when you follow-up. 3.  Return to the ER for worsening symptoms, persistent vomiting, difficulty breathing or other concerns.

## 2021-09-18 NOTE — ED Notes (Signed)
Pt to MRI

## 2021-09-18 NOTE — ED Notes (Signed)
Call Daughter Reggy Eye, (number on chart) for update on admission or discharge.  She will pick up if discharged.

## 2021-09-18 NOTE — ED Notes (Signed)
Pts daughter Alonna Minium) called and notified of the patients pending discharge - Daughter to arrive within 20 mins.

## 2021-09-18 NOTE — ED Notes (Signed)
Pt back from MRI 

## 2023-03-27 IMAGING — MR MR HEAD W/O CM
13 series · 45 of 48 positions shown · non-contrast
Comparison: No prior MRI, correlation is made with CT 09/17/2021

CLINICAL DATA: Memory loss, double vision

EXAM:
MRI HEAD WITHOUT CONTRAST
TECHNIQUE: Multiplanar, multiecho pulse sequences of the brain and surrounding
structures were obtained without intravenous contrast.

[Series 7: ax dwi_tracew · axial · 3.0mm · 0.65mm/px · z∈[-49,+118]mm · 3 of 52 slices shown]
[im 1/52]
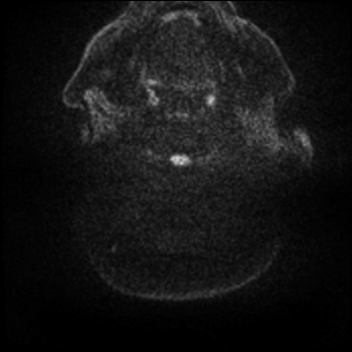
[im 26/52]
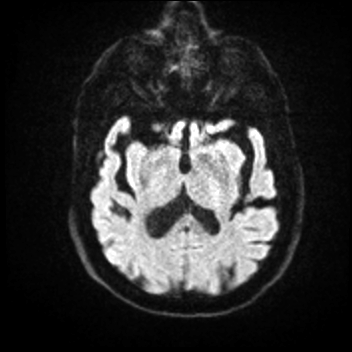
[im 52/52]
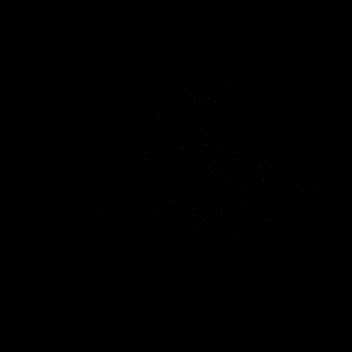

[Series 8: ax dwi_adc · axial · 3.0mm · 0.65mm/px · z∈[-49,+108]mm · 3 of 49 slices shown]
[im 1/49]
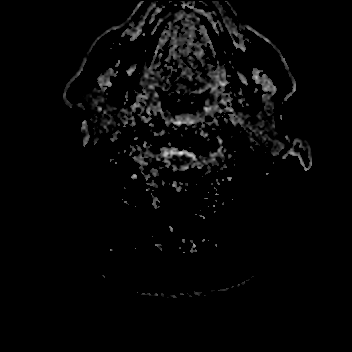
[im 25/49]
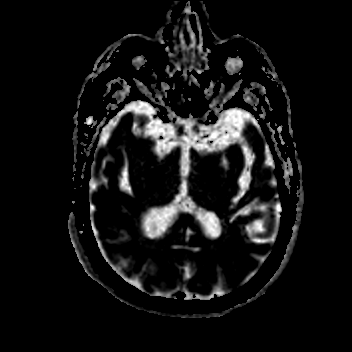
[im 49/49]
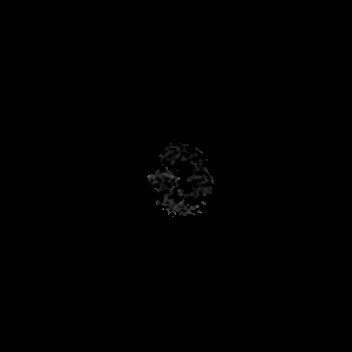

[Series 9: cor dwi_tracew · coronal · 5.0mm · 0.65mm/px · 3 of 46 slices shown]
[im 1/46]
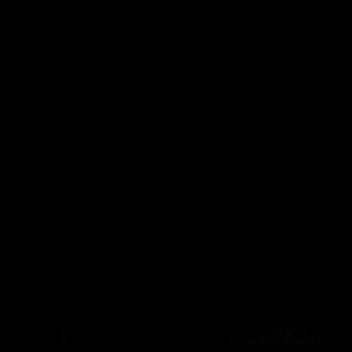
[im 23/46]
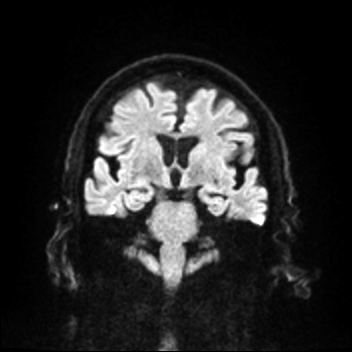
[im 46/46]
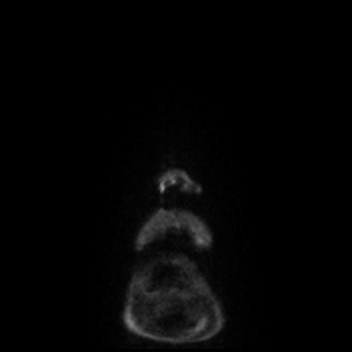

[Series 10: cor dwi_adc · coronal · 5.0mm · 0.65mm/px · 3 of 44 slices shown]
[im 1/44]
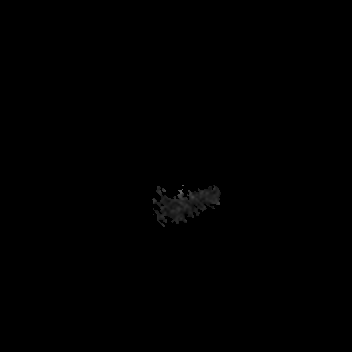
[im 22/44]
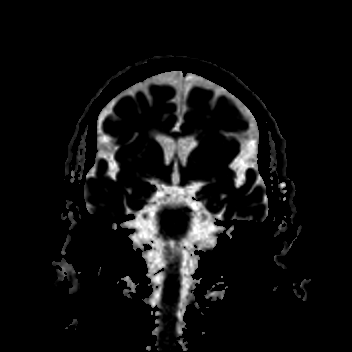
[im 44/44]
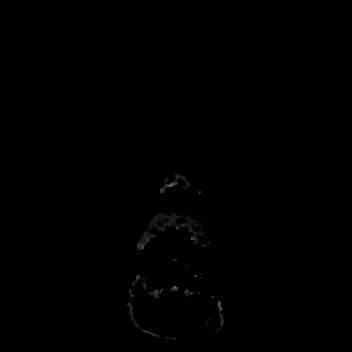

[Series 11: T1 · sagittal · 5.0mm · 0.62mm/px · 2 of 26 slices shown (1 of 2)]
[im 1/26]
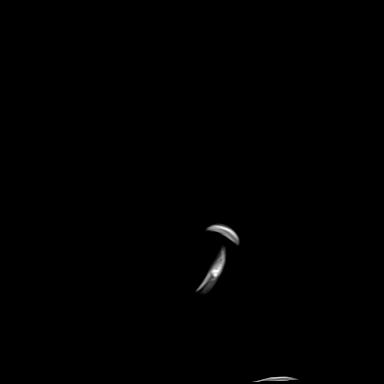
[im 26/26]
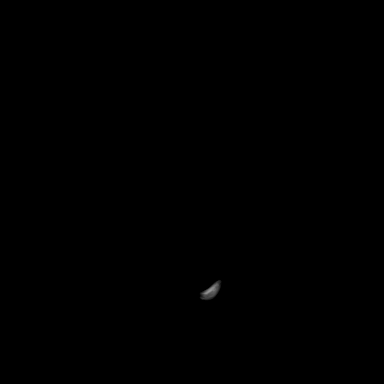

[Series 12: T2 · axial · 5.0mm · 0.58mm/px · z∈[-42,+133]mm · 2 of 31 slices shown (1 of 3)]
[im 1/31]
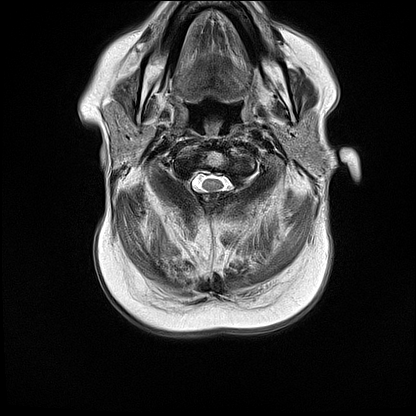
[im 31/31]
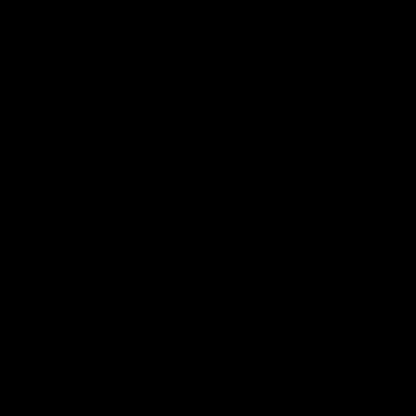

[Series 13: FLAIR · axial · 3.0mm · 0.58mm/px · z∈[-28,+129]mm · 4 of 55 slices shown]
[im 1/55]
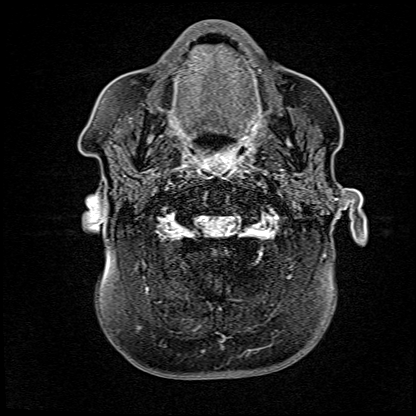
[im 19/55]
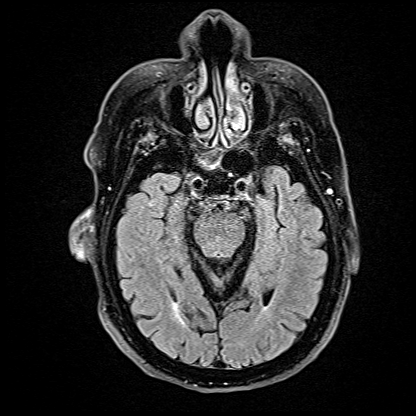
[im 37/55]
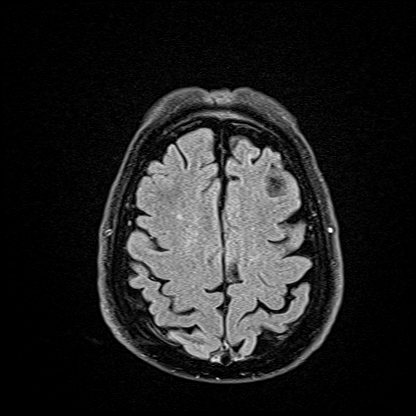
[im 55/55]
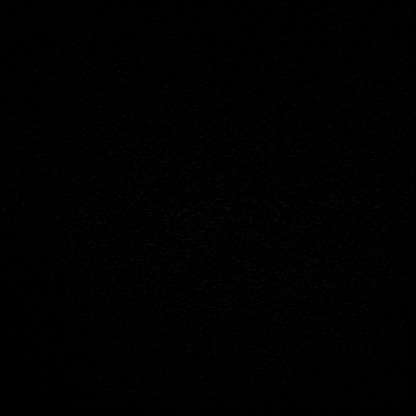

[Series 14: T2 · axial · 5.0mm · 0.58mm/px · z∈[-36,+140]mm · 2 of 31 slices shown (2 of 3)]
[im 1/31]
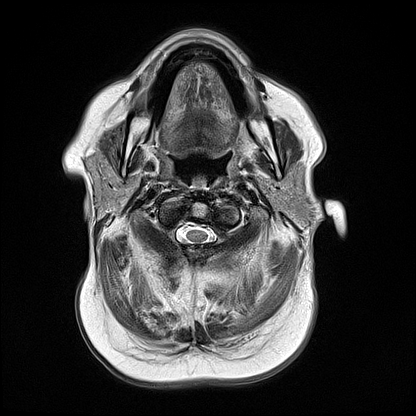
[im 31/31]
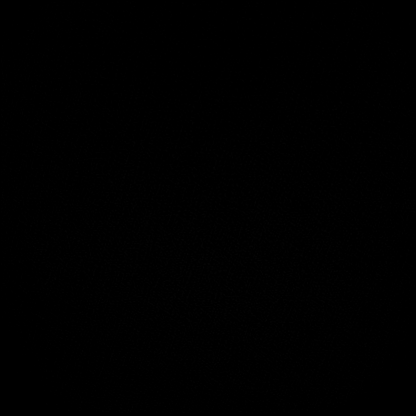

[Series 15: mag_images · axial · 3.0mm · 0.90mm/px · z∈[-37,+135]mm · 4 of 60 slices shown]
[im 1/60]
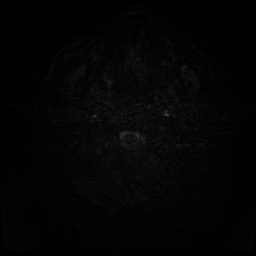
[im 20/60]
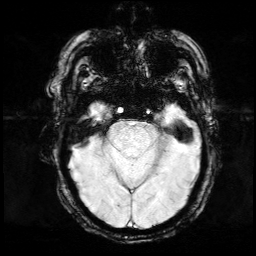
[im 40/60]
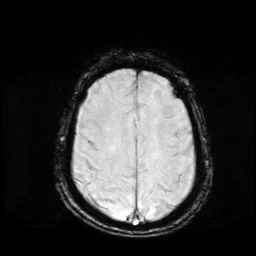
[im 60/60]
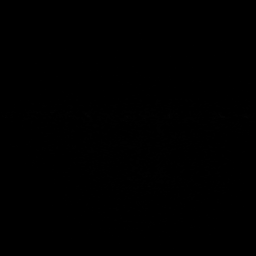

[Series 16: pha_images · axial · 3.0mm · 0.90mm/px · z∈[-37,+135]mm · 4 of 59 slices shown]
[im 1/59]
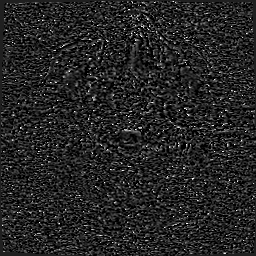
[im 20/59]
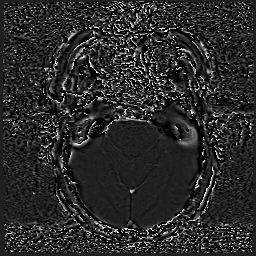
[im 39/59]
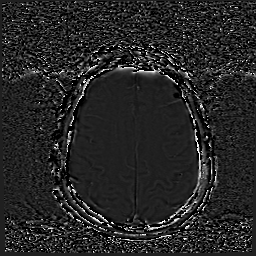
[im 59/59]
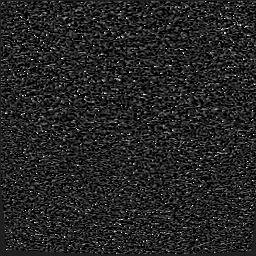

[Series 17: swi_images · axial · 3.0mm · 0.90mm/px · z∈[-37,+129]mm · 4 of 58 slices shown]
[im 1/58]
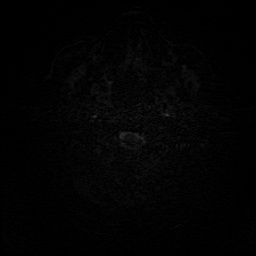
[im 20/58]
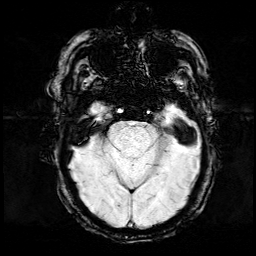
[im 39/58]
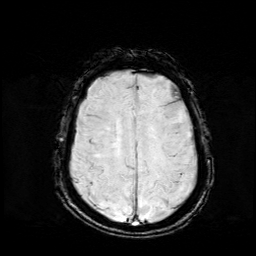
[im 58/58]
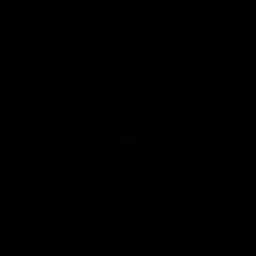

[Series 19: T1 · axial · 1.0mm · 0.98mm/px · z∈[-48,+125]mm · 9 of 175 slices shown (2 of 2)]
[im 1/175]
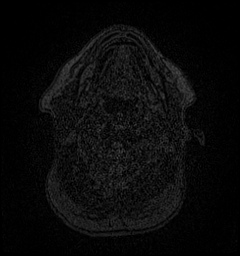
[im 16/175]
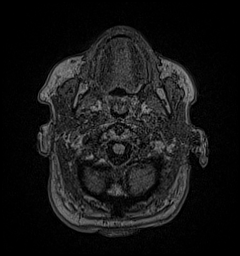
[im 32/175]
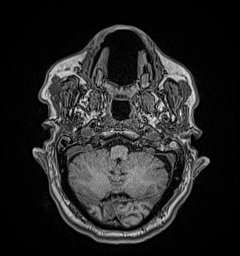
[im 48/175]
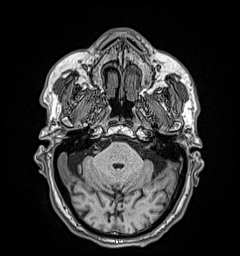
[im 80/175]
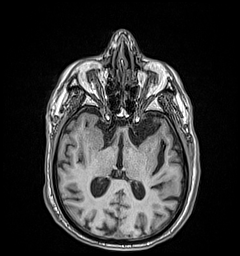
[im 95/175]
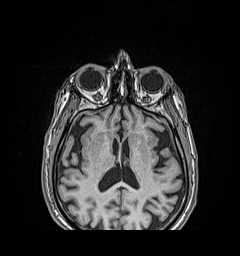
[im 127/175]
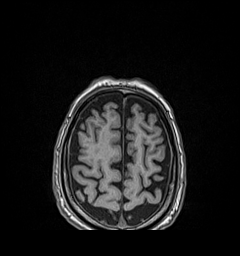
[im 143/175]
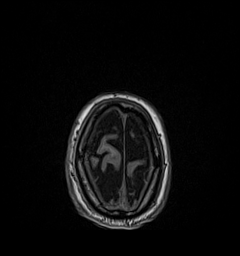
[im 175/175]
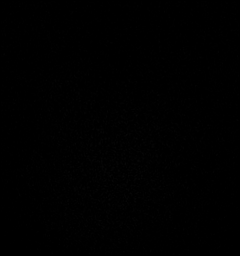

[Series 20: T2 · coronal · 5.0mm · 0.57mm/px · 2 of 33 slices shown (3 of 3)]
[im 1/33]
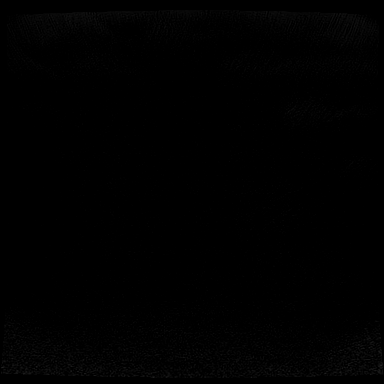
[im 33/33]
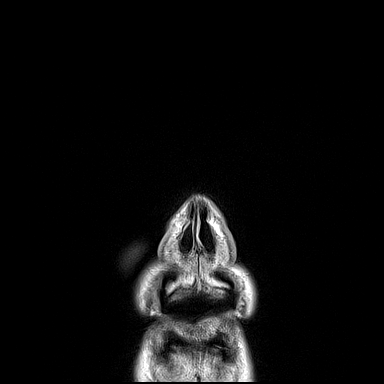

[45 of 48 positions shown; findings below may reference images not displayed]

FINDINGS: Brain: No restricted diffusion to suggest acute or subacute
infarction. No acute hemorrhage, mass, mass effect, or midline
shift. Cerebral atrophy is within normal limits for age; no lobar
predominance. No hydrocephalus or extra-axial collection. Scattered
T2 hyperintense signal in the periventricular white matter, likely
the sequela of mild chronic small vessel ischemic disease. No foci
of hemosiderin deposition to suggest remote hemorrhage or
superficial siderosis.

Vascular: Normal flow voids.

Skull and upper cervical spine: Normal marrow signal.

Sinuses/Orbits: Negative.  Status post bilateral lens replacements.

Other: The mastoids are well aerated.
IMPRESSION: No acute intracranial process. No etiology is seen for the patient's
memory loss or double vision.

## 2023-08-03 ENCOUNTER — Ambulatory Visit: Payer: Medicaid Other | Admitting: Internal Medicine

## 2023-12-24 ENCOUNTER — Ambulatory Visit (INDEPENDENT_AMBULATORY_CARE_PROVIDER_SITE_OTHER): Payer: Medicaid Other | Admitting: Physician Assistant

## 2023-12-24 ENCOUNTER — Encounter: Payer: Self-pay | Admitting: Physician Assistant

## 2023-12-24 VITALS — BP 129/60 | HR 79 | Temp 97.8°F | Resp 16 | Ht 65.0 in | Wt 164.4 lb

## 2023-12-24 DIAGNOSIS — E039 Hypothyroidism, unspecified: Secondary | ICD-10-CM | POA: Diagnosis not present

## 2023-12-24 DIAGNOSIS — H9193 Unspecified hearing loss, bilateral: Secondary | ICD-10-CM | POA: Diagnosis not present

## 2023-12-24 DIAGNOSIS — R262 Difficulty in walking, not elsewhere classified: Secondary | ICD-10-CM

## 2023-12-24 DIAGNOSIS — I10 Essential (primary) hypertension: Secondary | ICD-10-CM

## 2023-12-24 DIAGNOSIS — Z7689 Persons encountering health services in other specified circumstances: Secondary | ICD-10-CM

## 2023-12-24 NOTE — Progress Notes (Signed)
 [facility] [address] [place], [place] [address]  Internal MEDICINE  Office Visit Note  Patient Name: Bruce Zavala  [id number]  [id number]  Date of Service: 12/24/2023   Complaints/HPI Pt is here for establishment of PCP. Chief Complaint  Patient presents with   New Patient (Initial Visit)   HPI Pt is here to establish care, his daughter assists with translation -previously followed at [facility], but last visit over 1 yr ago and was too far away -he and his wife live with their daughter who cares for them -hx of HTN and hypothyroid -daughter reports main concerns are his hearing and that he is sometimes unstable on feet, uses a cane. Has a walker at home, but doesn't like to use. Reinforced use of walker, especially if leaving home to help with safety -Wears hearing aids, but they are old and would like referral to audiology for new ones -sleeping well -never been a smoker, no alcohol use -he had covid a few weeks ago, but is doing well. -no breathing concerns  Current Medication: Outpatient Encounter Medications as of 12/24/2023  Medication Sig   ibuprofen (ADVIL,MOTRIN) 600 MG tablet Take 1 tablet (600 mg total) by mouth every 8 (eight) hours as needed.   levothyroxine (SYNTHROID) 88 MCG tablet Take 88 mcg by mouth daily.   losartan (COZAAR) 100 MG tablet Take 100 mg by mouth daily.   [DISCONTINUED] aspirin EC 81 MG tablet Take 100 mg by mouth daily. (Patient not taking: Reported on 12/24/2023)   [DISCONTINUED] hydrochlorothiazide (HYDRODIURIL) 12.5 MG tablet Take 1 tablet (12.5 mg total) by mouth daily.   [DISCONTINUED] methylPREDNISolone (MEDROL DOSEPAK) 4 MG TBPK tablet Take one pak as directed   [DISCONTINUED] oxyCODONE-acetaminophen (ROXICET) 5-325 MG per tablet Take 1 tablet by mouth every 6 (six) hours as needed.   No facility-administered encounter medications on file as of 12/24/2023.    Surgical History: History reviewed. No pertinent  surgical history.  Medical History: Past Medical History:  Diagnosis Date   Hypertension    Thyroid disease     Family History: Family History  Problem Relation Age of Onset   Colon cancer Sister     Social History   Socioeconomic History   Marital status: Married    Spouse name: Not on file   Number of children: Not on file   Years of education: Not on file   Highest education level: Not on file  Occupational History   Not on file  Tobacco Use   Smoking status: Never    Passive exposure: Never   Smokeless tobacco: Not on file  Substance and Sexual Activity   Alcohol use: Never   Drug use: Never   Sexual activity: Not on file  Other Topics Concern   Not on file  Social History Narrative   Not on file   Social Drivers of Health   Financial Resource Strain: Not on file  Food Insecurity: Not on file  Transportation Needs: Not on file  Physical Activity: Not on file  Stress: Not on file  Social Connections: Not on file  Intimate Partner Violence: Not on file     Review of Systems  Constitutional:  Negative for chills, fatigue and unexpected weight change.  HENT:  Positive for hearing loss. Negative for congestion, postnasal drip, rhinorrhea, sneezing and sore throat.   Eyes:  Negative for redness.  Respiratory:  Negative for cough, chest tightness and shortness of breath.   Cardiovascular:  Negative for chest pain and  palpitations.  Gastrointestinal:  Negative for constipation, diarrhea and nausea.  Genitourinary:  Negative for dysuria and frequency.  Musculoskeletal:  Negative for back pain, joint swelling and neck pain.  Skin:  Negative for rash.  Neurological:  Positive for weakness. Negative for tremors, syncope and numbness.  Hematological:  Negative for adenopathy. Does not bruise/bleed easily.  Psychiatric/Behavioral:  Negative for behavioral problems (Depression), sleep disturbance and suicidal ideas. The patient is not nervous/anxious.     Vital  Signs: BP 129/60   Pulse 79   Temp 97.8 F (36.6 C)   Resp 16   Ht 5\' 5"  (1.651 m)   Wt 164 lb 6.4 oz (74.6 kg)   SpO2 94%   BMI 27.36 kg/m    Physical Exam Vitals and nursing note reviewed.  Constitutional:      General: He is not in acute distress.    Appearance: Normal appearance. He is well-developed. He is not diaphoretic.  HENT:     Head: Normocephalic and atraumatic.  Neck:     Thyroid: No thyromegaly.     Vascular: No JVD.     Trachea: No tracheal deviation.  Cardiovascular:     Rate and Rhythm: Normal rate and regular rhythm.     Heart sounds: Normal heart sounds. No murmur heard.    No friction rub. No gallop.  Pulmonary:     Effort: Pulmonary effort is normal. No respiratory distress.     Breath sounds: No wheezing or rales.  Musculoskeletal:        General: Normal range of motion.     Cervical back: Normal range of motion and neck supple.  Lymphadenopathy:     Cervical: No cervical adenopathy.  Skin:    General: Skin is warm and dry.  Neurological:     Mental Status: He is alert.     Gait: Gait abnormal.     Comments: Uses cane  Psychiatric:        Behavior: Behavior normal.        Thought Content: Thought content normal.        Judgment: Judgment normal.       Assessment/Plan: 1. Essential hypertension (Primary) Well controlled, continue losartan as before  2. Bilateral hearing loss, unspecified hearing loss type Will refer to audiology for possible new hearing aids - Ambulatory referral to Audiology  3. Impaired ambulation Encouraged to utilize walker for safety  4. Hypothyroidism, unspecified type Continue synthroid as before  5. Encounter to establish care with new doctor Reviewed medications and will schedule annual wellness    General Counseling: Jeral verbalizes understanding of the findings of todays visit and agrees with plan of treatment. I have discussed any further diagnostic evaluation that may be needed or ordered today.  We also reviewed his medications today. he has been encouraged to call the office with any questions or concerns that should arise related to todays visit.    Counseling:    Orders Placed This Encounter  Procedures   Ambulatory referral to Audiology    No orders of the defined types were placed in this encounter.    This patient was seen by Lynn Ito, PA-C in collaboration with Dr. Beverely Risen as a part of collaborative care agreement.   Time spent:30 Minutes

## 2023-12-25 ENCOUNTER — Telehealth: Payer: Self-pay | Admitting: Physician Assistant

## 2023-12-25 NOTE — Telephone Encounter (Signed)
 Audiology referral sent via [facility] to [facility].  Notified daughter. Gave telephone# [phone]-[phone]

## 2023-12-31 ENCOUNTER — Telehealth: Payer: Self-pay | Admitting: Physician Assistant

## 2023-12-31 NOTE — Telephone Encounter (Signed)
 Audiologist appointment 01/11/2024 @ [facility]

## 2024-06-24 ENCOUNTER — Ambulatory Visit: Payer: Medicaid Other | Admitting: Physician Assistant

## 2024-06-24 ENCOUNTER — Encounter: Payer: Self-pay | Admitting: Physician Assistant

## 2024-06-24 VITALS — BP 138/62 | HR 69 | Temp 98.0°F | Resp 16 | Ht 65.0 in | Wt 164.0 lb

## 2024-06-24 DIAGNOSIS — E039 Hypothyroidism, unspecified: Secondary | ICD-10-CM

## 2024-06-24 DIAGNOSIS — R1310 Dysphagia, unspecified: Secondary | ICD-10-CM | POA: Diagnosis not present

## 2024-06-24 DIAGNOSIS — I10 Essential (primary) hypertension: Secondary | ICD-10-CM

## 2024-06-24 DIAGNOSIS — R0602 Shortness of breath: Secondary | ICD-10-CM

## 2024-06-24 DIAGNOSIS — Z0001 Encounter for general adult medical examination with abnormal findings: Secondary | ICD-10-CM

## 2024-06-24 NOTE — Progress Notes (Signed)
 [facility] [address] [place], [place] [address]  Internal MEDICINE  Office Visit Note  Patient Name: Bruce Zavala  [id number]  [id number]  Date of Service: 06/24/2024  Chief Complaint  Patient presents with   Annual Exam   Hypertension   Fatigue   Shortness of Breath     HPI Pt is here for routine health maintenance examination, his daughter assists with translation -Pt becoming more tired with exertion, gets more SOB with exertion -coughing some with eating harder foods, trying softer foods, maybe getting choked up on things. Discussed small bites/sips and eating slowly.  Staying upright with meals. If not helping will consider Swallow study. Lungs clear on exam -Bp a little  -low back pain for years, not walking much and sitting most of the day now and will try to increase mobility/changing position and stretching -BP overall controlled, a little higher the last week -Going to [place] in 2 weeks and would like labs prior  Current Medication: Outpatient Encounter Medications as of 06/24/2024  Medication Sig   ibuprofen  (ADVIL ,MOTRIN ) 600 MG tablet Take 1 tablet (600 mg total) by mouth every 8 (eight) hours as needed.   levothyroxine (SYNTHROID) 88 MCG tablet Take 88 mcg by mouth daily.   losartan (COZAAR) 100 MG tablet Take 100 mg by mouth daily.   No facility-administered encounter medications on file as of 06/24/2024.    Surgical History: History reviewed. No pertinent surgical history.  Medical History: Past Medical History:  Diagnosis Date   Hypertension    Thyroid  disease     Family History: Family History  Problem Relation Age of Onset   Colon cancer Sister       Review of Systems  Constitutional:  Negative for chills, fatigue and unexpected weight change.  HENT:  Positive for hearing loss and trouble swallowing. Negative for congestion, postnasal drip, rhinorrhea, sneezing and sore throat.   Eyes:  Negative for redness.   Respiratory:  Negative for chest tightness.        SOBOE  Cardiovascular:  Negative for chest pain and palpitations.  Gastrointestinal:  Negative for constipation, diarrhea and nausea.  Genitourinary:  Negative for dysuria and frequency.  Musculoskeletal:  Negative for back pain, joint swelling and neck pain.  Skin:  Negative for rash.  Neurological:  Positive for weakness. Negative for tremors, syncope and numbness.  Hematological:  Negative for adenopathy. Does not bruise/bleed easily.  Psychiatric/Behavioral:  Negative for behavioral problems (Depression), sleep disturbance and suicidal ideas. The patient is not nervous/anxious.      Vital Signs: BP 138/62 Comment: 150/60  Pulse 69   Temp 98 F (36.7 C)   Resp 16   Ht 5' 5 (1.651 m)   Wt 164 lb (74.4 kg)   SpO2 93%   BMI 27.29 kg/m    Physical Exam Vitals and nursing note reviewed.  Constitutional:      General: He is not in acute distress.    Appearance: Normal appearance. He is well-developed. He is not diaphoretic.  HENT:     Head: Normocephalic and atraumatic.  Eyes:     Extraocular Movements: Extraocular movements intact.  Neck:     Thyroid : No thyromegaly.     Vascular: No JVD.     Trachea: No tracheal deviation.  Cardiovascular:     Rate and Rhythm: Normal rate and regular rhythm.     Heart sounds: Normal heart sounds. No murmur heard.    No friction rub. No gallop.  Pulmonary:  Effort: Pulmonary effort is normal. No respiratory distress.     Breath sounds: No wheezing or rales.  Abdominal:     Tenderness: There is no abdominal tenderness.  Musculoskeletal:        General: Normal range of motion.     Cervical back: Normal range of motion and neck supple.  Lymphadenopathy:     Cervical: No cervical adenopathy.  Skin:    General: Skin is warm and dry.  Neurological:     Mental Status: He is alert.     Gait: Gait abnormal.     Comments: Uses cane  Psychiatric:        Behavior: Behavior normal.         Thought Content: Thought content normal.        Judgment: Judgment normal.      LABS: No results found for this or any previous visit (from the past 2160 hours).      Assessment/Plan: 1. Encounter for general adult medical examination with abnormal findings (Primary) CPE performed, lab slip given  2. Essential hypertension Stable, continue losartan  3. Hypothyroidism, unspecified type Continue synthroid and will update labs  4. SOBOE (shortness of breath on exertion) Lungs clear on exam, may be due to increased decreased activity/endurance. Will check labs and will monitor. Advised to call if new or worsening symptoms  5. Dysphagia, unspecified type Will try small bites/sips, eating slowly, softer foods, and staying upright for all meals and after. May consider swallow study in future   General Counseling: Ana verbalizes understanding of the findings of todays visit and agrees with plan of treatment. I have discussed any further diagnostic evaluation that may be needed or ordered today. We also reviewed his medications today. he has been encouraged to call the office with any questions or concerns that should arise related to todays visit.    Counseling:    No orders of the defined types were placed in this encounter.   No orders of the defined types were placed in this encounter.   This patient was seen by Tinnie Pro, PA-C in collaboration with Dr. Sigrid Bathe as a part of collaborative care agreement.  Total time spent:30 Minutes  Time spent includes review of chart, medications, test results, and follow up plan with the patient.     Sigrid CHRISTELLA Bathe, MD  Internal Medicine

## 2024-07-04 ENCOUNTER — Other Ambulatory Visit: Payer: Self-pay | Admitting: Physician Assistant

## 2024-07-05 ENCOUNTER — Ambulatory Visit: Payer: Self-pay | Admitting: Physician Assistant

## 2024-07-05 LAB — CBC WITH DIFFERENTIAL/PLATELET
Basophils Absolute: 0.1 x10E3/uL (ref 0.0–0.2)
Basos: 2 %
EOS (ABSOLUTE): 0.2 x10E3/uL (ref 0.0–0.4)
Eos: 4 %
Hematocrit: 50.3 % (ref 37.5–51.0)
Hemoglobin: 16.1 g/dL (ref 13.0–17.7)
Immature Grans (Abs): 0 x10E3/uL (ref 0.0–0.1)
Immature Granulocytes: 0 %
Lymphocytes Absolute: 1.2 x10E3/uL (ref 0.7–3.1)
Lymphs: 23 %
MCH: 28.7 pg (ref 26.6–33.0)
MCHC: 32 g/dL (ref 31.5–35.7)
MCV: 90 fL (ref 79–97)
Monocytes Absolute: 0.5 x10E3/uL (ref 0.1–0.9)
Monocytes: 10 %
Neutrophils Absolute: 3.2 x10E3/uL (ref 1.4–7.0)
Neutrophils: 61 %
Platelets: 277 x10E3/uL (ref 150–450)
RBC: 5.61 x10E6/uL (ref 4.14–5.80)
RDW: 14 % (ref 11.6–15.4)
WBC: 5.2 x10E3/uL (ref 3.4–10.8)

## 2024-07-05 LAB — COMPREHENSIVE METABOLIC PANEL WITH GFR
ALT: 14 IU/L (ref 0–44)
AST: 19 IU/L (ref 0–40)
Albumin: 3.9 g/dL (ref 3.6–4.6)
Alkaline Phosphatase: 121 IU/L (ref 44–121)
BUN/Creatinine Ratio: 23 (ref 10–24)
BUN: 23 mg/dL (ref 10–36)
Bilirubin Total: 1.5 mg/dL — ABNORMAL HIGH (ref 0.0–1.2)
CO2: 24 mmol/L (ref 20–29)
Calcium: 9.1 mg/dL (ref 8.6–10.2)
Chloride: 105 mmol/L (ref 96–106)
Creatinine, Ser: 0.99 mg/dL (ref 0.76–1.27)
Globulin, Total: 3 g/dL (ref 1.5–4.5)
Glucose: 101 mg/dL — ABNORMAL HIGH (ref 70–99)
Potassium: 4.2 mmol/L (ref 3.5–5.2)
Sodium: 141 mmol/L (ref 134–144)
Total Protein: 6.9 g/dL (ref 6.0–8.5)
eGFR: 70 mL/min/1.73 (ref >59)

## 2024-07-05 LAB — FERRITIN: Ferritin: 195 ng/mL (ref 30–400)

## 2024-07-05 LAB — IRON AND TIBC
Iron Saturation: 37 % (ref 15–55)
Iron: 96 ug/dL (ref 38–169)
Total Iron Binding Capacity: 260 ug/dL (ref 250–450)
UIBC: 164 ug/dL (ref 111–343)

## 2024-07-05 LAB — LIPID PANEL WITH LDL/HDL RATIO
Cholesterol, Total: 165 mg/dL (ref 100–199)
HDL: 49 mg/dL (ref >39)
LDL Chol Calc (NIH): 99 mg/dL (ref 0–99)
LDL/HDL Ratio: 2 ratio (ref 0.0–3.6)
Triglycerides: 94 mg/dL (ref 0–149)
VLDL Cholesterol Cal: 17 mg/dL (ref 5–40)

## 2024-07-05 LAB — T4, FREE: Free T4: 1.4 ng/dL (ref 0.82–1.77)

## 2024-07-05 LAB — TSH: TSH: 2.9 u[IU]/mL (ref 0.450–4.500)

## 2024-07-05 NOTE — Telephone Encounter (Signed)
Spoke with patient's daughter regarding lab results 

## 2024-07-05 NOTE — Telephone Encounter (Signed)
-----   Message from Tinnie MARLA Pro sent at 07/05/2024  2:05 PM EDT ----- Please let him know overall labs looked good. Sugar borderline high and bilirubin a little elevated but this appears chronic ----- Message ----- From: Interface, [facility] Lab Results In Sent: 07/05/2024   7:21 AM EDT To: Tinnie MARLA Pro, PA-C

## 2024-08-24 NOTE — Progress Notes (Signed)
 Large Joint Injection: R subacromial bursa  Date/Time: 08/24/2024 3:00 PM  Performed by: Charlene Debby Bruckner, PA Authorized by: Charlene Debby Bruckner, PA   Consent Given by:  Patient Timeout: timeout called immediately prior to procedure   Indications:  Pain Needle Size:  25 G Guidance: ultrasound   Location:  Shoulder Site:  R subacromial bursa Topical skin anesthesia: obtained using ethyl chloride spray   Medications:  3 mL BUPivacaine HCl 0.5 %; 3 mL lidocaine 1 %; 40 mg triamcinolone acetonide 40 mg/mL Outcome:  Tolerated well, no immediate complications

## 2024-10-10 ENCOUNTER — Other Ambulatory Visit: Payer: Self-pay

## 2024-10-10 ENCOUNTER — Emergency Department

## 2024-10-10 ENCOUNTER — Inpatient Hospital Stay
Admission: EM | Admit: 2024-10-10 | Discharge: 2024-10-13 | DRG: 291 | Disposition: A | Attending: Internal Medicine | Admitting: Internal Medicine

## 2024-10-10 DIAGNOSIS — I480 Paroxysmal atrial fibrillation: Secondary | ICD-10-CM | POA: Diagnosis present

## 2024-10-10 DIAGNOSIS — I110 Hypertensive heart disease with heart failure: Principal | ICD-10-CM | POA: Diagnosis present

## 2024-10-10 DIAGNOSIS — Z603 Acculturation difficulty: Secondary | ICD-10-CM | POA: Diagnosis present

## 2024-10-10 DIAGNOSIS — R1312 Dysphagia, oropharyngeal phase: Secondary | ICD-10-CM | POA: Diagnosis present

## 2024-10-10 DIAGNOSIS — J9601 Acute respiratory failure with hypoxia: Secondary | ICD-10-CM | POA: Diagnosis present

## 2024-10-10 DIAGNOSIS — Z79890 Hormone replacement therapy (postmenopausal): Secondary | ICD-10-CM

## 2024-10-10 DIAGNOSIS — I5033 Acute on chronic diastolic (congestive) heart failure: Secondary | ICD-10-CM | POA: Diagnosis present

## 2024-10-10 DIAGNOSIS — E039 Hypothyroidism, unspecified: Secondary | ICD-10-CM | POA: Diagnosis present

## 2024-10-10 DIAGNOSIS — I4891 Unspecified atrial fibrillation: Secondary | ICD-10-CM | POA: Diagnosis present

## 2024-10-10 DIAGNOSIS — Z800 Family history of malignant neoplasm of digestive organs: Secondary | ICD-10-CM

## 2024-10-10 DIAGNOSIS — I5032 Chronic diastolic (congestive) heart failure: Secondary | ICD-10-CM | POA: Insufficient documentation

## 2024-10-10 DIAGNOSIS — J189 Pneumonia, unspecified organism: Secondary | ICD-10-CM

## 2024-10-10 DIAGNOSIS — Z79899 Other long term (current) drug therapy: Secondary | ICD-10-CM

## 2024-10-10 DIAGNOSIS — J690 Pneumonitis due to inhalation of food and vomit: Secondary | ICD-10-CM | POA: Insufficient documentation

## 2024-10-10 DIAGNOSIS — Z880 Allergy status to penicillin: Secondary | ICD-10-CM

## 2024-10-10 DIAGNOSIS — Z66 Do not resuscitate: Secondary | ICD-10-CM | POA: Diagnosis present

## 2024-10-10 DIAGNOSIS — Z1152 Encounter for screening for COVID-19: Secondary | ICD-10-CM

## 2024-10-10 DIAGNOSIS — I4821 Permanent atrial fibrillation: Secondary | ICD-10-CM | POA: Diagnosis present

## 2024-10-10 LAB — URINALYSIS, COMPLETE (UACMP) WITH MICROSCOPIC
Bilirubin Urine: NEGATIVE
Glucose, UA: NEGATIVE mg/dL
Ketones, ur: NEGATIVE mg/dL
Leukocytes,Ua: NEGATIVE
Nitrite: NEGATIVE
Protein, ur: NEGATIVE mg/dL
RBC / HPF: >50 RBC/hpf (ref 0–5)
Specific Gravity, Urine: 1.006 (ref 1.005–1.030)
Squamous Epithelial / HPF: 0 /HPF (ref 0–5)
pH: 5 (ref 5.0–8.0)

## 2024-10-10 LAB — BASIC METABOLIC PANEL WITH GFR
Anion gap: 10 (ref 5–15)
BUN: 23 mg/dL (ref 8–23)
CO2: 27 mmol/L (ref 22–32)
Calcium: 9.4 mg/dL (ref 8.9–10.3)
Chloride: 104 mmol/L (ref 98–111)
Creatinine, Ser: 0.94 mg/dL (ref 0.61–1.24)
GFR, Estimated: >60 mL/min (ref >60)
Glucose, Bld: 105 mg/dL — ABNORMAL HIGH (ref 70–99)
Potassium: 4 mmol/L (ref 3.5–5.1)
Sodium: 141 mmol/L (ref 135–145)

## 2024-10-10 LAB — CBC
HCT: 47 % (ref 39.0–52.0)
Hemoglobin: 15.4 g/dL (ref 13.0–17.0)
MCH: 28.5 pg (ref 26.0–34.0)
MCHC: 32.8 g/dL (ref 30.0–36.0)
MCV: 87 fL (ref 80.0–100.0)
Platelets: 229 K/uL (ref 150–400)
RBC: 5.4 MIL/uL (ref 4.22–5.81)
RDW: 15.4 % (ref 11.5–15.5)
WBC: 5.9 K/uL (ref 4.0–10.5)
nRBC: 0 % (ref 0.0–0.2)

## 2024-10-10 LAB — RESP PANEL BY RT-PCR (RSV, FLU A&B, COVID)  RVPGX2
Influenza A by PCR: NEGATIVE
Influenza B by PCR: NEGATIVE
Resp Syncytial Virus by PCR: NEGATIVE
SARS Coronavirus 2 by RT PCR: NEGATIVE

## 2024-10-10 LAB — TROPONIN T, HIGH SENSITIVITY: Troponin T High Sensitivity: 19 ng/L (ref 0–19)

## 2024-10-10 LAB — TSH: TSH: 3.57 u[IU]/mL (ref 0.350–4.500)

## 2024-10-10 LAB — PRO BRAIN NATRIURETIC PEPTIDE: Pro Brain Natriuretic Peptide: 2919 pg/mL — ABNORMAL HIGH (ref <300.0)

## 2024-10-10 MED ORDER — SODIUM CHLORIDE 0.9 % IV SOLN: 250.0000 mL | INTRAVENOUS | Status: AC | PRN

## 2024-10-10 MED ORDER — SODIUM CHLORIDE 0.9 % IV SOLN: 1.0000 g | INTRAVENOUS | Status: DC

## 2024-10-10 MED ORDER — METOPROLOL TARTRATE 25 MG PO TABS
12.5000 mg | ORAL_TABLET | Freq: Two times a day (BID) | ORAL | Status: DC
  Administered 2024-10-10 – 2024-10-12 (×4): 12.5 mg via ORAL
  Filled 2024-10-10 (×5): qty 1

## 2024-10-10 MED ORDER — METOPROLOL TARTRATE 5 MG/5ML IV SOLN: 2.5000 mg | INTRAVENOUS | Status: DC | PRN

## 2024-10-10 MED ORDER — APIXABAN 2.5 MG PO TABS
2.5000 mg | ORAL_TABLET | Freq: Two times a day (BID) | ORAL | Status: DC
  Administered 2024-10-10 – 2024-10-13 (×6): 2.5 mg via ORAL
  Filled 2024-10-10 (×7): qty 1

## 2024-10-10 MED ORDER — ACETAMINOPHEN 325 MG PO TABS
650.0000 mg | ORAL_TABLET | ORAL | Status: DC | PRN
  Administered 2024-10-10: 650 mg via ORAL
  Filled 2024-10-10: qty 2

## 2024-10-10 MED ORDER — FUROSEMIDE 10 MG/ML IJ SOLN
20.0000 mg | Freq: Every day | INTRAMUSCULAR | Status: DC
  Administered 2024-10-10: 17:00:00 20 mg via INTRAVENOUS
  Filled 2024-10-10: qty 4

## 2024-10-10 MED ORDER — SODIUM CHLORIDE 0.9 % IV SOLN
500.0000 mg | Freq: Once | INTRAVENOUS | Status: DC
  Filled 2024-10-10: qty 5

## 2024-10-10 MED ORDER — SODIUM CHLORIDE 0.9 % IV SOLN
100.0000 mg | Freq: Two times a day (BID) | INTRAVENOUS | Status: DC
  Administered 2024-10-10 – 2024-10-11 (×2): 100 mg via INTRAVENOUS
  Filled 2024-10-10 (×2): qty 100

## 2024-10-10 MED ORDER — SODIUM CHLORIDE 0.9 % IV SOLN
2.0000 g | Freq: Once | INTRAVENOUS | Status: AC
  Administered 2024-10-10: 16:00:00 2 g via INTRAVENOUS
  Filled 2024-10-10: qty 20

## 2024-10-10 MED ORDER — DIPHENHYDRAMINE HCL 50 MG/ML IJ SOLN
12.5000 mg | Freq: Once | INTRAMUSCULAR | Status: AC
  Administered 2024-10-10: 19:00:00 12.5 mg via INTRAVENOUS
  Filled 2024-10-10: qty 1

## 2024-10-10 MED ORDER — SODIUM CHLORIDE 0.9% FLUSH
3.0000 mL | Freq: Two times a day (BID) | INTRAVENOUS | Status: DC
  Administered 2024-10-10 – 2024-10-13 (×6): 3 mL via INTRAVENOUS

## 2024-10-10 MED ORDER — DIGOXIN 0.25 MG/ML IJ SOLN
0.1250 mg | Freq: Once | INTRAMUSCULAR | Status: AC
  Administered 2024-10-10: 17:00:00 0.125 mg via INTRAVENOUS
  Filled 2024-10-10: qty 2

## 2024-10-10 MED ORDER — LEVOTHYROXINE SODIUM 88 MCG PO TABS
88.0000 ug | ORAL_TABLET | Freq: Every day | ORAL | Status: DC
  Administered 2024-10-11 – 2024-10-13 (×3): 88 ug via ORAL
  Filled 2024-10-10 (×4): qty 1

## 2024-10-10 MED ORDER — LOSARTAN POTASSIUM 50 MG PO TABS
100.0000 mg | ORAL_TABLET | Freq: Every day | ORAL | Status: DC
  Administered 2024-10-11 – 2024-10-12 (×2): 100 mg via ORAL
  Filled 2024-10-10 (×2): qty 2

## 2024-10-10 MED ORDER — SODIUM CHLORIDE 0.9% FLUSH: 3.0000 mL | INTRAVENOUS | Status: DC | PRN

## 2024-10-10 MED ORDER — ONDANSETRON HCL 4 MG/2ML IJ SOLN: 4.0000 mg | Freq: Four times a day (QID) | INTRAMUSCULAR | Status: DC | PRN

## 2024-10-10 NOTE — ED Notes (Signed)
 Admitting floor coverage NP Natalie informed of pt and pts family concern for painful and bloody urination. On assessment pt denies flank pain. no abd tenderness. no foul smelling urine. no confusion. no fever since my arrival at 1900. New orders placed by NP. Urine sent. PO tylenol  given for pain management per NP recommendation

## 2024-10-10 NOTE — H&P (Addendum)
 History and Physical    Bruce Zavala FMW:[id number] DOB: Jul 19, 1926 DOA: 10/10/2024  PCP: [name], PA-C (Confirm with patient/family/NH records and if not entered, this has to be entered at [facility] point of entry) Patient coming from: Home   I have personally briefly reviewed patient's old medical records in [facility]  Chief Complaint: SOB, ankle swelling  HPI: Bruce Zavala is a 89 y.o. male with medical history significant of HTN, hypothyroidism, presented with worsening of shortness of breath and ankle edema.  Daughter at bedside gave history.  Patient has been having increasing exertional dyspnea for 2 weeks.  Along with increasing bilateral ankle edema.  There was no cough associated but daughter did mention that patient does have cough or choking after eating solid food  for several months.  No chest pain no fever or chills.  Last few days even walking inside bedroom causing significant shortness of breath and patient also developed orthopnea woke up 3-4 times last night because of shortness of breath.  ED Course: Afebrile, heart rate 98-1 20s, in A-fib, blood pressure 108/55.  O2 saturation 88% on room air.  Chest x-ray showed multifocal pneumonia versus pulmonary congestion, blood work showed WBC 6.9 hemoglobin 15 BUN 23 creatinine 2.9 bicarb 27K 4.0.  Patient was given ceftriaxone  x 1 and started to develop upper lip swelling. Review of Systems: As per HPI otherwise 14 point review of systems negative.    Past Medical History:  Diagnosis Date   Hypertension    Thyroid  disease     History reviewed. No pertinent surgical history.   reports that he has never smoked. He has never been exposed to tobacco smoke. He does not have any smokeless tobacco history on file. He reports that he does not drink alcohol and does not use drugs.  Allergies[1]  Family History  Problem Relation Age of Onset   Colon cancer Sister      Prior to Admission medications   Medication Sig Start Date End Date Taking? Authorizing Provider  levothyroxine  (SYNTHROID ) 88 MCG tablet Take 88 mcg by mouth daily.   Yes [provider]  losartan  (COZAAR ) 100 MG tablet Take 100 mg by mouth daily.   Yes [provider]  ibuprofen  (ADVIL ,MOTRIN ) 600 MG tablet Take 1 tablet (600 mg total) by mouth every 8 (eight) hours as needed. Patient not taking: Reported on 10/10/2024 06/28/15   Trudy Dorn BRAVO, MD    Physical Exam: Vitals:   10/10/24 1500 10/10/24 1530 10/10/24 1700 10/10/24 1706  BP: 128/87 (!) 144/79 132/79 132/79  Pulse: 87 97 73 85  Resp: (!) 24 (!) 27 (!) 29 (!) 23  Temp:      SpO2: 96% 95%  96%  Weight:      Height:        Constitutional: NAD, calm, comfortable Vitals:   10/10/24 1500 10/10/24 1530 10/10/24 1700 10/10/24 1706  BP: 128/87 (!) 144/79 132/79 132/79  Pulse: 87 97 73 85  Resp: (!) 24 (!) 27 (!) 29 (!) 23  Temp:      SpO2: 96% 95%  96%  Weight:      Height:       Eyes: PERRL, lids and conjunctivae normal ENMT: Mucous membranes are moist. Posterior pharynx clear of any exudate or lesions.Normal dentition.  Neck: normal, supple, no masses, no thyromegaly Respiratory: clear to auscultation bilaterally, no wheezing, bilateral fine crackles to the mid levels, increasing respiratory effort. No accessory muscle use.  Cardiovascular: Regular rate  and rhythm, no murmurs / rubs / gallops.  1+ extremity edema. 2+ pedal pulses. No carotid bruits.  Abdomen: no tenderness, no masses palpated. No hepatosplenomegaly. Bowel sounds positive.  Musculoskeletal: no clubbing / cyanosis. No joint deformity upper and lower extremities. Good ROM, no contractures. Normal muscle tone.  Skin: no rashes, lesions, ulcers. No induration Neurologic: CN 2-12 grossly intact. Sensation intact, DTR normal. Strength 5/5 in all 4.  Psychiatric: Normal judgment and insight. Alert and oriented x 3. Normal mood.    Labs on Admission: I have personally  reviewed following labs and imaging studies  CBC: Recent Labs  Lab 10/10/24 1342  WBC 5.9  HGB 15.4  HCT 47.0  MCV 87.0  PLT 229   Basic Metabolic Panel: Recent Labs  Lab 10/10/24 1342  NA 141  K 4.0  CL 104  CO2 27  GLUCOSE 105*  BUN 23  CREATININE 0.94  CALCIUM 9.4   GFR: Estimated Creatinine Clearance: 39.8 mL/min (by C-G formula based on SCr of 0.94 mg/dL). Liver Function Tests: No results for input(s): AST, ALT, ALKPHOS, BILITOT, PROT, ALBUMIN in the last 168 hours. No results for input(s): LIPASE, AMYLASE in the last 168 hours. No results for input(s): AMMONIA in the last 168 hours. Coagulation Profile: No results for input(s): INR, PROTIME in the last 168 hours. Cardiac Enzymes: No results for input(s): CKTOTAL, CKMB, CKMBINDEX, TROPONINI in the last 168 hours. BNP (last 3 results) Recent Labs    10/10/24 1342  PROBNP 2,919.0*   HbA1C: No results for input(s): HGBA1C in the last 72 hours. CBG: No results for input(s): GLUCAP in the last 168 hours. Lipid Profile: No results for input(s): CHOL, HDL, LDLCALC, TRIG, CHOLHDL, LDLDIRECT in the last 72 hours. Thyroid  Function Tests: No results for input(s): TSH, T4TOTAL, FREET4, T3FREE, THYROIDAB in the last 72 hours. Anemia Panel: No results for input(s): VITAMINB12, FOLATE, FERRITIN, TIBC, IRON, RETICCTPCT in the last 72 hours. Urine analysis:    Component Value Date/Time   COLORURINE YELLOW (A) 09/18/2021 0129   APPEARANCEUR CLEAR (A) 09/18/2021 0129   LABSPEC 1.018 09/18/2021 0129   PHURINE 5.0 09/18/2021 0129   GLUCOSEU NEGATIVE 09/18/2021 0129   HGBUR NEGATIVE 09/18/2021 0129   BILIRUBINUR NEGATIVE 09/18/2021 0129   KETONESUR NEGATIVE 09/18/2021 0129   PROTEINUR NEGATIVE 09/18/2021 0129   NITRITE NEGATIVE 09/18/2021 0129   LEUKOCYTESUR NEGATIVE 09/18/2021 0129    Radiological Exams on Admission: US  Venous Img Lower Unilateral  Right Result Date: 10/10/2024 CLINICAL DATA:  Right lower extremity edema EXAM: RIGHT LOWER EXTREMITY VENOUS DOPPLER ULTRASOUND TECHNIQUE: Gray-scale sonography with compression, as well as color and duplex ultrasound, were performed to evaluate the deep venous system(s) from the level of the common femoral vein through the popliteal and proximal calf veins. COMPARISON:  None Available. FINDINGS: VENOUS Normal compressibility of the common femoral, superficial femoral, and popliteal veins, as well as the visualized calf veins. Visualized portions of profunda femoral vein and great saphenous vein unremarkable. No filling defects to suggest DVT on grayscale or color Doppler imaging. Doppler waveforms show normal direction of venous flow, normal respiratory plasticity and response to augmentation. Limited views of the contralateral common femoral vein are unremarkable. OTHER None. Limitations: none IMPRESSION: Negative. Electronically Signed   By: Wilkie Lent M.D.   On: 10/10/2024 16:08   DG Chest 2 View Result Date: 10/10/2024 EXAM: 2 VIEW(S) XRAY OF THE CHEST 10/10/2024 02:04:26 PM COMPARISON: 06/28/2015. CLINICAL HISTORY: SOB SOB FINDINGS: LUNGS AND PLEURA: Shallow inspiration. There are coarse  linear infiltrates in the mid and lower lungs, developing since the prior study. This may represent atelectasis, aspiration, or multifocal pneumonia. The pattern is less likely to represent edema. No pleural effusion or pneumothorax. HEART AND MEDIASTINUM: Heart size is normal. Mediastinal contours appear intact. Calcification of the aorta. BONES AND SOFT TISSUES: Degenerative changes in the spine and shoulders. IMPRESSION: 1. Coarse linear opacities in the mid and lower lungs, new from the prior exam, most consistent with atelectasis, aspiration, or multifocal pneumonia; edema is less likely. Electronically signed by: Elsie Gravely MD 10/10/2024 02:22 PM EST RP Workstation: [id number]    EKG: Independently  reviewed.  A-fib with RVR, no acute ST changes.  Assessment/Plan Principal Problem:   Afib (HCC) Active Problems:   Aspiration pneumonia (HCC)   A-fib (HCC)   Acute on chronic diastolic CHF (congestive heart failure) (HCC)  (please populate well all problems here in Problem List. (For example, if patient is on BP meds at home and you resume or decide to hold them, it is a problem that needs to be her. Same for CAD, COPD, HLD and so on)  Acute, probably on chronic HF decompensation, diastolic dysfunction suspected. -CHF conversation likely secondary to new onset A-fib with RVR - Continue IV diuresis Lasix  20 mg daily - Echocardiogram when rate controlled - Rate controlled A-fib as below. - Other Ddx, also consider aspiration pneumonia as described by family and patient has a seemingly chronic dysphagia.  Appears to be at oropharyngeal level.  Will start patient on doxycycline .  A-fib with RVR - Given patient has concurrent CHF compression, we will use digoxin  x 1 - As needed Lopressor  for rate control - Start low-dose of metoprolol  12.5 mg twice daily - CHADS2= 2, no history of GI bleed, hemoglobin level stable, explained to daughter at bedside regarding benefits and risks of anticoagulation, family agreed with Eliquis .  Dysphagia Chronic aspiration Aspiration pneumonia - Patient has significant oropharyngeal dysphagia as described by daughter.  Will consult speech therapist - History of penicillin allergy, and patient was started on ceftriaxone  however patient developed upper lip swelling after ceftriaxone  infusion, ceftriaxone  discontinued.  Will start patient on doxycycline . - Aspiration precaution  HTN - Continue losartan .  Hypothyroidism - Check TSH - Continue Synthroid   Deconditioning - PT evaluation  DVT prophylaxis: Eliquis  Code Status: DNR/DNI Family Communication: Daughter at bedside Disposition Plan: Patient is sick with new onset of CHF, new onset of A-fib with  RVR requiring IV diuresis and IV rate control medications, expect more than 2 midnight hospital stay. Consults called: None Admission status: PCU admit   Cort ONEIDA Mana MD [facility] Pager [phone]  10/10/2024, 5:46 PM        [1]  Allergies Allergen Reactions   Penicillin G Itching, Shortness Of Breath and Swelling   Penicillins Other (See Comments)    unk

## 2024-10-10 NOTE — ED Notes (Signed)
 Pt placed on 2L [place] and O2 now at 90%.

## 2024-10-10 NOTE — ED Provider Notes (Addendum)
 [facility] Provider Note    Event Date/Time   First MD Initiated Contact with Patient 10/10/24 1344     (approximate)   History   Chief Complaint: Shortness of Breath   HPI  Bruce Zavala is a 89 y.o. male with a history of hypertension is brought to the ED due to shortness of breath for the last week along with decreased appetite.  Had pneumonia few months ago.  No chest pain, no lower extremity edema.  No fever.        Past Medical History:  Diagnosis Date   Hypertension    Thyroid  disease     Current Outpatient Rx   Order #: [id number] Class: Print   Order #: [id number] Class: Historical Med   Order #: [id number] Class: Historical Med    History reviewed. No pertinent surgical history.  Physical Exam   Triage Vital Signs: ED Triage Vitals  Encounter Vitals Group     BP 10/10/24 1325 123/70     Girls Systolic BP Percentile --      Girls Diastolic BP Percentile --      Boys Systolic BP Percentile --      Boys Diastolic BP Percentile --      Pulse Rate 10/10/24 1325 (!) 45     Resp 10/10/24 1325 20     Temp 10/10/24 1325 98 F (36.7 C)     Temp src --      SpO2 10/10/24 1325 (!) 88 %     Weight 10/10/24 1326 135 lb (61.2 kg)     Height 10/10/24 1326 5' 7 (1.702 m)     Head Circumference --      Peak Flow --      Pain Score 10/10/24 1326 0     Pain Loc --      Pain Education --      Exclude from Growth Chart --     Most recent vital signs: Vitals:   10/10/24 1400 10/10/24 1500  BP: (!) 108/55 128/87  Pulse: 65 87  Resp: (!) 23 (!) 24  Temp:    SpO2: 95% 96%    General: Awake, no distress.  CV:  Good peripheral perfusion.  Irregular rhythm, tachycardia heart rate 120 Resp:  Normal effort.  Clear lungs Abd:  No distention.  Soft nontender Other:  Trace right lower extremity edema, normal left lower extremity.  Increased calf circumference on the right.   ED Results / Procedures / Treatments   Labs (all labs  ordered are listed, but only abnormal results are displayed) Labs Reviewed  BASIC METABOLIC PANEL WITH GFR - Abnormal; Notable for the following components:      Result Value   Glucose, Bld 105 (*)    All other components within normal limits  PRO BRAIN NATRIURETIC PEPTIDE - Abnormal; Notable for the following components:   Pro Brain Natriuretic Peptide 2,919.0 (*)    All other components within normal limits  RESP PANEL BY RT-PCR (RSV, FLU A&B, COVID)  RVPGX2  CBC  TROPONIN T, HIGH SENSITIVITY  TROPONIN T, HIGH SENSITIVITY     EKG Interpreted by me Atrial fibrillation, rate of 126.  Normal axis, normal intervals.  Normal QRS ST segments and T waves.  2 PVCs on the strip.   RADIOLOGY Chest x-ray interpreted by me, shows bilateral lower lung opacities concerning for pneumonia.  Radiology report reviewed   PROCEDURES:  Procedures   MEDICATIONS ORDERED IN ED: Medications  cefTRIAXone  (ROCEPHIN ) 2 g in  sodium chloride  0.9 % 100 mL IVPB (has no administration in time range)  azithromycin  (ZITHROMAX ) 500 mg in sodium chloride  0.9 % 250 mL IVPB (has no administration in time range)     IMPRESSION / MDM / ASSESSMENT AND PLAN / ED COURSE  I reviewed the triage vital signs and the nursing notes.  DDx: Pneumonia, pleural effusion, pulmonary edema, DVT, COVID, influenza, non-STEMI, electrolyte derangement, anemia  Patient's presentation is most consistent with acute presentation with potential threat to life or bodily function.  Patient presents with shortness of breath, malaise, found to be hypoxic to 88% on room air.  Chest x-ray concerning for pneumonia possible aspiration.  Will start antibiotics and plan to admit.   Clinical Course as of 10/10/24 1543  Mon Oct 10, 2024  1540 Case discussed with hospitalist [PS]    Clinical Course User Index [PS] Viviann Pastor, MD     FINAL CLINICAL IMPRESSION(S) / ED DIAGNOSES   Final diagnoses:  Acute respiratory failure with  hypoxia (HCC)  Pneumonia due to infectious organism, unspecified laterality, unspecified part of lung     Rx / DC Orders   ED Discharge Orders     None        Note:  This document was prepared using Dragon voice recognition software and may include unintentional dictation errors.   Viviann Pastor, MD 10/10/24 1518    Viviann Pastor, MD 10/10/24 [phone]

## 2024-10-10 NOTE — ED Notes (Signed)
 No repeat trop per MD.

## 2024-10-10 NOTE — ED Triage Notes (Signed)
 Pt comes with sob for past several days. Pt has had dec appetite. Pt had 3 episodes last night of sob. Pt denies any pain.

## 2024-10-10 NOTE — ED Triage Notes (Signed)
 Brought from [facility]. C/o sob and no appetite X1 week. Seen two months ago for bilateral pneumonia. MD sent patient and concerned about CHF.  KC vitals: 125/67 b/p 96% RA 71HR 97.0 oral

## 2024-10-10 NOTE — ED Notes (Signed)
 Swelling noted to upper lip after rocephin  administration. Pt denies issues breathing or swallowing. Jerel Mana, MD secure chat to notify of same.

## 2024-10-10 NOTE — Consult Note (Signed)
 PHARMACY - ANTICOAGULATION CONSULT NOTE  Pharmacy Consult for Apixaban  Indication: atrial fibrillation  Allergies[1]  Patient Measurements: Height: 5' 7 (170.2 cm) Weight: 61.2 kg (135 lb) IBW/kg (Calculated) : 66.1 HEPARIN DW (KG): 61.2  Labs: Recent Labs    10/10/24 1342  HGB 15.4  HCT 47.0  PLT 229  CREATININE 0.94    Estimated Creatinine Clearance: 39.8 mL/min (by C-G formula based on SCr of 0.94 mg/dL).   Medical History: Past Medical History:  Diagnosis Date   Hypertension    Thyroid  disease     Medications:  No anticoagulation prior to admission per my chart review  Assessment: 89 y/o M with medical history as above presenting with shortness of breath. Found to be in Afib. Pharmacy consulted for apixaban  dosing.  CHA2DS2VASc = 3 (HTN, age). ECHO is pending  Age >44, Wt ~ 60 kg  Plan:  --Apixaban  2.5 mg BID --Discussed opting for reduced dosing given extreme of age and borderline weight --CBC per protocol  Marolyn KATHEE Mare 10/10/2024,4:06 PM    [1]  Allergies Allergen Reactions   Penicillin G Itching, Shortness Of Breath and Swelling   Penicillins Other (See Comments)    unk

## 2024-10-10 NOTE — Progress Notes (Signed)
 Patient presents to triage with complaints of orthopnea, loss of appetite.  Recently treated for pneumonia.  To ED for further evaluation and care.

## 2024-10-11 ENCOUNTER — Other Ambulatory Visit (HOSPITAL_COMMUNITY): Payer: Self-pay

## 2024-10-11 ENCOUNTER — Telehealth (HOSPITAL_COMMUNITY): Payer: Self-pay

## 2024-10-11 ENCOUNTER — Inpatient Hospital Stay

## 2024-10-11 ENCOUNTER — Inpatient Hospital Stay: Admit: 2024-10-11 | Discharge: 2024-10-11 | Disposition: A | Attending: Internal Medicine

## 2024-10-11 DIAGNOSIS — I509 Heart failure, unspecified: Secondary | ICD-10-CM

## 2024-10-11 LAB — BASIC METABOLIC PANEL WITH GFR
Anion gap: 10 (ref 5–15)
BUN: 25 mg/dL — ABNORMAL HIGH (ref 8–23)
CO2: 26 mmol/L (ref 22–32)
Calcium: 8.4 mg/dL — ABNORMAL LOW (ref 8.9–10.3)
Chloride: 104 mmol/L (ref 98–111)
Creatinine, Ser: 1.05 mg/dL (ref 0.61–1.24)
GFR, Estimated: >60 mL/min (ref >60)
Glucose, Bld: 97 mg/dL (ref 70–99)
Potassium: 3.5 mmol/L (ref 3.5–5.1)
Sodium: 140 mmol/L (ref 135–145)

## 2024-10-11 LAB — PROCALCITONIN: Procalcitonin: <0.1 ng/mL

## 2024-10-11 MED ORDER — FUROSEMIDE 10 MG/ML IJ SOLN
40.0000 mg | Freq: Two times a day (BID) | INTRAMUSCULAR | Status: DC
  Administered 2024-10-11 – 2024-10-12 (×3): 40 mg via INTRAVENOUS
  Filled 2024-10-11 (×3): qty 4

## 2024-10-11 MED ORDER — TAMSULOSIN HCL 0.4 MG PO CAPS
0.4000 mg | ORAL_CAPSULE | Freq: Every day | ORAL | Status: DC
  Administered 2024-10-11 – 2024-10-13 (×3): 0.4 mg via ORAL
  Filled 2024-10-11 (×3): qty 1

## 2024-10-11 NOTE — Evaluation (Signed)
 Physical Therapy Evaluation Patient Details Name: Bruce Zavala MRN: [id number] DOB: 04/02/1926 Today's Date: 10/11/2024  History of Present Illness  89 y.o. male with medical history significant of HTN, hypothyroidism, presented with worsening of shortness of breath and ankle edema. MD assessment: pneumonia, a-fib  Clinical Impression  Pt was lying in bed on arrival with family members at bedside, was pleasant and motivated to participate during the session and put forth good effort throughout. In house interpreter (Loyda) used during this session. Pt had really great strength in his upper and lower extremities which allowed him to perform bed mobility without assistance other than supervision. During transitional movement pts HR was 168 but leveled out with rest in sitting, in standing it was 108, and RR was 38 (nursing notified). His HR and RR both leveled out to a normal range quickly, but impaired his ability to safely mobilize more than that until assessed further while in the ED. Pt did use a RW for extra support while transferring to standing and could benefit from using one at home for more stability while dealing with his current health situation. Pt will benefit from continued PT services upon discharge to safely address deficits listed in patient problem list for decreased caregiver assistance and eventual return to PLOF.       If plan is discharge home, recommend the following: A little help with walking and/or transfers;Assistance with cooking/housework;Help with stairs or ramp for entrance;Assist for transportation;A little help with bathing/dressing/bathroom   Can travel by private vehicle        Equipment Recommendations Rolling walker (2 wheels)  Recommendations for Other Services       Functional Status Assessment Patient has had a recent decline in their functional status and demonstrates the ability to make significant improvements in function in a reasonable and  predictable amount of time.     Precautions / Restrictions Precautions Precautions: Fall Recall of Precautions/Restrictions: Intact Restrictions Weight Bearing Restrictions Per Provider Order: No      Mobility  Bed Mobility Overal bed mobility: Needs Assistance Bed Mobility: Supine to Sit     Supine to sit: Supervision     General bed mobility comments: needed cueing for use of bed rails and scooting to EOB    Transfers Overall transfer level: Needs assistance Equipment used: Rolling walker (2 wheels) Transfers: Sit to/from Stand Sit to Stand: Contact guard assist                Ambulation/Gait               General Gait Details: unable to safely assess due to HR and RR  Stairs            Wheelchair Mobility     Tilt Bed    Modified Rankin (Stroke Patients Only)       Balance Overall balance assessment: Modified Independent                                           Pertinent Vitals/Pain Pain Assessment Pain Assessment: No/denies pain    Home Living Family/patient expects to be discharged to:: Private residence Living Arrangements: Children;Spouse/significant other Available Help at Discharge: Family;Available 24 hours/day Type of Home: House Home Access: Level entry;Other (comment)       Home Layout: One level Home Equipment: Cane - single point;Shower seat;BSC/3in1      Prior Function  Prior Level of Function : Independent/Modified Independent             Mobility Comments: Pt uses SPC and also furniture walks ADLs Comments: Pt washes himself and dresses himself. Family assists with bathing     Extremity/Trunk Assessment   Upper Extremity Assessment Upper Extremity Assessment: Overall WFL for tasks assessed    Lower Extremity Assessment Lower Extremity Assessment: Overall WFL for tasks assessed       Communication   Communication Communication: No apparent difficulties Factors Affecting  Communication: Hearing impaired;Non - English speaking   Cognition Arousal: Alert Behavior During Therapy: WFL for tasks assessed/performed   PT - Cognitive impairments: No apparent impairments                         Following commands: Intact       Cueing Cueing Techniques: Verbal cues, Gestural cues     General Comments General comments (skin integrity, edema, etc.): pt has good strength overall, but HR and RR are unstable currently interfering with safe mobility. HR at EOB: 168, standing: 108. RR standing: 38    Exercises Other Exercises Other Exercises: pt edu on the role of PT in acute care, importance of mobility, equip recs and dc recs   Assessment/Plan    PT Assessment Patient needs continued PT services  PT Problem List Decreased activity tolerance;Decreased balance;Decreased mobility;Decreased safety awareness;Cardiopulmonary status limiting activity       PT Treatment Interventions DME instruction;Gait training;Stair training;Functional mobility training;Therapeutic activities;Therapeutic exercise;Balance training;Patient/family education    PT Goals (Current goals can be found in the Care Plan section)       Frequency Min 2X/week     Co-evaluation PT/OT/SLP Co-Evaluation/Treatment: Yes Reason for Co-Treatment: For patient/therapist safety;Complexity of the patient's impairments (multi-system involvement);To address functional/ADL transfers PT goals addressed during session: Mobility/safety with mobility OT goals addressed during session: ADL's and self-care       AM-PAC PT 6 Clicks Mobility  Outcome Measure Help needed turning from your back to your side while in a flat bed without using bedrails?: A Little Help needed moving from lying on your back to sitting on the side of a flat bed without using bedrails?: A Little Help needed moving to and from a bed to a chair (including a wheelchair)?: A Little Help needed standing up from a chair  using your arms (e.g., wheelchair or bedside chair)?: A Little Help needed to walk in hospital room?: A Little Help needed climbing 3-5 steps with a railing? : A Little 6 Click Score: 18    End of Session   Activity Tolerance: Other (comment) (HR and RR) Patient left: in bed;with call bell/phone within reach;with bed alarm set;with family/visitor present Nurse Communication: Mobility status PT Visit Diagnosis: Difficulty in walking, not elsewhere classified (R26.2)    Time: 8987-8963 PT Time Calculation (min) (ACUTE ONLY): 24 min   Charges:                 Corean Newport, SPT 10/11/2024, 12:12 PM

## 2024-10-11 NOTE — TOC Initial Note (Signed)
 Transition of Care [facility]) - Initial/Assessment Note    Patient Details  Name: Bruce Zavala MRN: [id number] Date of Birth: 06/18/1926  [facility] CM/SW Contact:    Victory Jackquline RAMAN, RN Phone Number: 10/11/2024, 2:40 PM  Clinical Narrative: RNCM  met with patient and daughter at bedside. RNCM Introduced role and explained that discharge planning would be discussed. Patient's daughter translated for the patient. She said that the father didn't have his hearing aides in and it's better for him if she translates for him. He can't hear that translator on the monitor. Patient lives with his spouse, daughter, and son-in-law. He has a walker and a shower chair that belongs to his wife but he uses it. The daughter and son-in-law assist the parents with their ADL's. The daughter would like to get an Aide to come in to assist with her fathers care. She has dropped down to part time so that she can help with her parents care. The daughter take the patient to his appointments and he has no issues getting his medication. RNCM will continue to follow for discharge planning needs.            Expected Discharge Plan: Home/Self Care Barriers to Discharge: Continued Medical Work up   Patient Goals and CMS Choice            Expected Discharge Plan and Services       Living arrangements for the past 2 months: Single Family Home                                      Prior Living Arrangements/Services Living arrangements for the past 2 months: Single Family Home Lives with:: Self, Spouse, Adult Children Patient language and need for interpreter reviewed:: Yes Do you feel safe going back to the place where you live?: Yes      Need for Family Participation in Patient Care: Yes (Comment) Care giver support system in place?: Yes (comment) Current home services: DME (Walker and a shower chair) Criminal Activity/Legal Involvement Pertinent to Current Situation/Hospitalization: No -  Comment as needed  Activities of Daily Living      Permission Sought/Granted                  Emotional Assessment Appearance:: Appears stated age, Well-Groomed Attitude/Demeanor/Rapport: Gracious Affect (typically observed): Calm, Pleasant, Quiet Orientation: : Oriented to Self, Oriented to Place, Oriented to  Time, Oriented to Situation Alcohol / Substance Use: Not Applicable Psych Involvement: No (comment)  Admission diagnosis:  Afib (HCC) [I48.91] Acute decompensated heart failure (HCC) [I50.9] Patient Active Problem List   Diagnosis Date Noted   Acute decompensated heart failure (HCC) 10/11/2024   Aspiration pneumonia (HCC) 10/10/2024   A-fib (HCC) 10/10/2024   Acute on chronic diastolic CHF (congestive heart failure) (HCC) 10/10/2024   Afib (HCC) 10/10/2024   PCP:  [name], PA-C Pharmacy:   [facility] [phone] [address], [place] - [address] [place] [address] Phone: [phone] Fax: [phone]     Social Drivers of Health (SDOH) Social History: SDOH Screenings   Food Insecurity: No Food Insecurity (08/24/2024)   Received from [facility]  Housing: Low Risk  (08/24/2024)   Received from [facility]  Transportation Needs: No Transportation Needs (08/24/2024)   Received from [facility]  Utilities: Not At Risk (08/24/2024)  Received from [facility]  Depression [phone]): Low Risk (06/24/2024)  Financial Resource Strain: Low Risk  (08/24/2024)   Received from [facility]  Tobacco Use: Unknown (10/10/2024)   SDOH Interventions:     Readmission Risk Interventions     No data to display

## 2024-10-11 NOTE — Discharge Instructions (Signed)
 Information on my medicine - ELIQUIS  (apixaban )  This medication education was reviewed with me or my healthcare representative as part of my discharge preparation.  The pharmacist that spoke with me during my hospital stay was:    Why was Eliquis  prescribed for you? Eliquis  was prescribed for you to reduce the risk of a blood clot forming that can cause a stroke if you have a medical condition called atrial fibrillation (a type of irregular heartbeat).  What do You need to know about Eliquis  ? Take your Eliquis  TWICE DAILY - one tablet in the morning and one tablet in the evening with or without food. If you have difficulty swallowing the tablet whole please discuss with your pharmacist how to take the medication safely.  Take Eliquis  exactly as prescribed by your doctor and DO NOT stop taking Eliquis  without talking to the doctor who prescribed the medication.  Stopping may increase your risk of developing a stroke.  Refill your prescription before you run out.  After discharge, you should have regular check-up appointments with your healthcare provider that is prescribing your Eliquis .  In the future your dose may need to be changed if your kidney function or weight changes by a significant amount or as you get older.  What do you do if you miss a dose? If you miss a dose, take it as soon as you remember on the same day and resume taking twice daily.  Do not take more than one dose of ELIQUIS  at the same time to make up a missed dose.  Important Safety Information A possible side effect of Eliquis  is bleeding. You should call your healthcare provider right away if you experience any of the following: Bleeding from an injury or your nose that does not stop. Unusual colored urine (red or dark brown) or unusual colored stools (red or black). Unusual bruising for unknown reasons. A serious fall or if you hit your head (even if there is no bleeding).  Some medicines may interact with  Eliquis  and might increase your risk of bleeding or clotting while on Eliquis . To help avoid this, consult your healthcare provider or pharmacist prior to using any new prescription or non-prescription medications, including herbals, vitamins, non-steroidal anti-inflammatory drugs (NSAIDs) and supplements.  This website has more information on Eliquis  (apixaban ): [email or web address] .com/eliquis /home   Referral made to the [facility] outpatient physical therapy clinic. If you do not receive a call scheduling an appointment. Please feel free to reach out to them.   [facility], [address], [place], [place] [address] Phone: [phone]

## 2024-10-11 NOTE — Progress Notes (Signed)
 PROGRESS NOTE    Bruce Zavala  FMW:[id number] DOB: 05/26/1926 DOA: 10/10/2024 PCP: [name], PA-C    Brief Narrative:   89 y.o. male with medical history significant of HTN, hypothyroidism, presented with worsening of shortness of breath and ankle edema.   Daughter at bedside gave history.  Patient has been having increasing exertional dyspnea for 2 weeks.  Along with increasing bilateral ankle edema.  There was no cough associated but daughter did mention that patient does have cough or choking after eating solid food  for several months.  No chest pain no fever or chills.  Last few days even walking inside bedroom causing significant shortness of breath and patient also developed orthopnea woke up 3-4 times last night because of shortness of breath.   ED Course: Afebrile, heart rate 98-1 20s, in A-fib, blood pressure 108/55.  O2 saturation 88% on room air.  Chest x-ray showed multifocal pneumonia versus pulmonary congestion, blood work showed WBC 6.9 hemoglobin 15 BUN 23 creatinine 2.9 bicarb 27K 4.0.     Assessment & Plan:   Principal Problem:   Afib (HCC) Active Problems:   Aspiration pneumonia (HCC)   A-fib (HCC)   Acute on chronic diastolic CHF (congestive heart failure) (HCC)   Acute decompensated heart failure (HCC)  Acute, probably on chronic HF decompensation, diastolic dysfunction suspected. Suspect this is related to new onset A-fib with RVR Plan: Continue IV diuresis Strict ins and outs, daily weights 2D echocardiogram Cardiology consultation based on echo   A-fib with RVR Patient was given digoxin  x 1 Currently in sinus rhythm Plan: Metoprolol  12.5 mg twice daily As needed Lopressor  for rate control Eliquis  for anticoagulation.  CHA2DS2-VASc is 2   Dysphagia Chronic aspiration Aspiration pneumonia, ruled out.  No evidence of infectious process. Plan: Discontinue doxycycline  SLP evaluation Aspiration  precautions  HTN Losartan  Metoprolol    Hypothyroidism TSH within normal limits - Continue Synthroid    Deconditioning Request PT evaluation    DVT prophylaxis: Eliquis  Code Status: DNR Family Communication: Granddaughter and daughter at bedside Disposition Plan: Status is: Inpatient Remains inpatient appropriate because: New onset CHF, new onset atrial fibrillation   Level of care: Telemetry  Consultants:  None  Procedures:  None  Antimicrobials: None   Subjective: Seen and examined.  History conducted in Spanish.  Granddaughter and daughter at bedside.  Patient feels well.  No complaints.  Shortness of breath improving.  Objective: Vitals:   10/11/24 1000 10/11/24 1030 10/11/24 1045 10/11/24 1327  BP: (!) 141/71 139/86  106/69  Pulse: 84 89  88  Resp:  (!) 26 18 (!) 26  Temp:    (!) 97.4 F (36.3 C)  TempSrc:    Oral  SpO2:  95%  92%  Weight:      Height:        Intake/Output Summary (Last 24 hours) at 10/11/2024 1405 Last data filed at 10/10/2024 1612 Gross per 24 hour  Intake 100 ml  Output --  Net 100 ml   Filed Weights   10/10/24 1326  Weight: 61.2 kg    Examination:  General exam: Appears calm and comfortable  Respiratory system: Bibasilar crackles.  Normal work of breathing.  2 L Cardiovascular system: S1-S2, RRR, no murmurs, trace pedal edema Gastrointestinal system: Soft, NT/ND, normal bowel sounds Central nervous system: Alert and oriented. No focal neurological deficits. Extremities: Symmetric 5 x 5 power. Skin: No rashes, lesions or ulcers Psychiatry: Judgement and insight appear normal. Mood & affect appropriate.  Data Reviewed: I have personally reviewed following labs and imaging studies  CBC: Recent Labs  Lab 10/10/24 1342  WBC 5.9  HGB 15.4  HCT 47.0  MCV 87.0  PLT 229   Basic Metabolic Panel: Recent Labs  Lab 10/10/24 1342 10/11/24 0401  NA 141 140  K 4.0 3.5  CL 104 104  CO2 27 26  GLUCOSE 105* 97   BUN 23 25*  CREATININE 0.94 1.05  CALCIUM 9.4 8.4*   GFR: Estimated Creatinine Clearance: 35.6 mL/min (by C-G formula based on SCr of 1.05 mg/dL). Liver Function Tests: No results for input(s): AST, ALT, ALKPHOS, BILITOT, PROT, ALBUMIN in the last 168 hours. No results for input(s): LIPASE, AMYLASE in the last 168 hours. No results for input(s): AMMONIA in the last 168 hours. Coagulation Profile: No results for input(s): INR, PROTIME in the last 168 hours. Cardiac Enzymes: No results for input(s): CKTOTAL, CKMB, CKMBINDEX, TROPONINI in the last 168 hours. BNP (last 3 results) Recent Labs    10/10/24 1342  PROBNP 2,919.0*   HbA1C: No results for input(s): HGBA1C in the last 72 hours. CBG: No results for input(s): GLUCAP in the last 168 hours. Lipid Profile: No results for input(s): CHOL, HDL, LDLCALC, TRIG, CHOLHDL, LDLDIRECT in the last 72 hours. Thyroid  Function Tests: Recent Labs    10/10/24 1342  TSH 3.570   Anemia Panel: No results for input(s): VITAMINB12, FOLATE, FERRITIN, TIBC, IRON, RETICCTPCT in the last 72 hours. Sepsis Labs: Recent Labs  Lab 10/11/24 0515  PROCALCITON <0.10    Recent Results (from the past 240 hours)  Resp panel by RT-PCR (RSV, Flu A&B, Covid) Anterior Nasal Swab     Status: None   Collection Time: 10/10/24  1:50 PM   Specimen: Anterior Nasal Swab  Result Value Ref Range Status   SARS Coronavirus 2 by RT PCR NEGATIVE NEGATIVE Final    Comment: (NOTE) SARS-CoV-2 target nucleic acids are NOT DETECTED.  The SARS-CoV-2 RNA is generally detectable in upper respiratory specimens during the acute phase of infection. The lowest concentration of SARS-CoV-2 viral copies this assay can detect is 138 copies/mL. A negative result does not preclude SARS-Cov-2 infection and should not be used as the sole basis for treatment or other patient management decisions. A negative result may occur  with  improper specimen collection/handling, submission of specimen other than nasopharyngeal swab, presence of viral mutation(s) within the areas targeted by this assay, and inadequate number of viral copies(<138 copies/mL). A negative result must be combined with clinical observations, patient history, and epidemiological information. The expected result is Negative.  Fact Sheet for Patients:  bloggercourse.com  Fact Sheet for Healthcare Providers:  seriousbroker.it  This test is no t yet approved or cleared by the [place]  FDA and  has been authorized for detection and/or diagnosis of SARS-CoV-2 by FDA under an Emergency Use Authorization (EUA). This EUA will remain  in effect (meaning this test can be used) for the duration of the COVID-19 declaration under Section 564(b)(1) of the Act, 21 U.S.C.section 360bbb-3(b)(1), unless the authorization is terminated  or revoked sooner.       Influenza A by PCR NEGATIVE NEGATIVE Final   Influenza B by PCR NEGATIVE NEGATIVE Final    Comment: (NOTE) The Xpert Xpress SARS-CoV-2/FLU/RSV plus assay is intended as an aid in the diagnosis of influenza from Nasopharyngeal swab specimens and should not be used as a sole basis for treatment. Nasal washings and aspirates are unacceptable for Xpert Xpress SARS-CoV-2/FLU/RSV testing.  Fact Sheet for Patients: bloggercourse.com  Fact Sheet for Healthcare Providers: seriousbroker.it  This test is not yet approved or cleared by the [place]  FDA and has been authorized for detection and/or diagnosis of SARS-CoV-2 by FDA under an Emergency Use Authorization (EUA). This EUA will remain in effect (meaning this test can be used) for the duration of the COVID-19 declaration under Section 564(b)(1) of the Act, 21 U.S.C. section 360bbb-3(b)(1), unless the authorization is terminated  or revoked.     Resp Syncytial Virus by PCR NEGATIVE NEGATIVE Final    Comment: (NOTE) Fact Sheet for Patients: bloggercourse.com  Fact Sheet for Healthcare Providers: seriousbroker.it  This test is not yet approved or cleared by the [place]  FDA and has been authorized for detection and/or diagnosis of SARS-CoV-2 by FDA under an Emergency Use Authorization (EUA). This EUA will remain in effect (meaning this test can be used) for the duration of the COVID-19 declaration under Section 564(b)(1) of the Act, 21 U.S.C. section 360bbb-3(b)(1), unless the authorization is terminated or revoked.  Performed at [facility], [address] [place], [place] [address]          Radiology Studies: CT RENAL STONE STUDY Result Date: 10/11/2024 EXAM: CT ABDOMEN AND PELVIS WITHOUT CONTRAST 10/11/2024 10:23:05 AM TECHNIQUE: CT of the abdomen and pelvis was performed without the administration of intravenous contrast. Multiplanar reformatted images are provided for review. Automated exposure control, iterative reconstruction, and/or weight-based adjustment of the mA/kV was utilized to reduce the radiation dose to as low as reasonably achievable. COMPARISON: None available. CLINICAL HISTORY: Abdominal/flank pain, stone suspected. FINDINGS: LOWER CHEST: Small right pleural effusion. Diffuse interstitial thickening with extensive patchy ground glass and airspace densities throughout the imaged portions of the lower lung zones. LIVER: The liver is unremarkable. GALLBLADDER AND BILE DUCTS: Gallbladder is unremarkable. No biliary ductal dilatation. SPLEEN: No acute abnormality. PANCREAS: No acute abnormality. ADRENAL GLANDS: No acute abnormality. KIDNEYS, URETERS AND BLADDER: No stones in the kidneys or ureters. No hydronephrosis. No perinephric or periureteral stranding. Partially decompressed bladder with diffuse bladder wall thickening.  Calcification along the undersurface of the dome of the bladder is noted. GI AND BOWEL: Stomach demonstrates no acute abnormality. The appendix is visualized and appears normal. Mild diffuse colonic stool burden. Colonic diverticula noted without signs of acute diverticulitis. There is no bowel obstruction. PERITONEUM AND RETROPERITONEUM: No ascites. No free air. No free fluid or fluid collections. No pneumoperitoneum. VASCULATURE: Aorta is normal in caliber. Aortic atherosclerosis. LYMPH NODES: No lymphadenopathy. No signs of abdominopelvic adenopathy. REPRODUCTIVE ORGANS: Mild prostate gland enlargement. BONES AND SOFT TISSUES: Thoracolumbar scoliosis deformity with multilevel lumbar spondylosis. No acute or suspicious osseous abnormality. Small fat-containing umbilical hernia. No focal soft tissue abnormality. IMPRESSION: 1. No acute findings in the abdomen or pelvis. 2. Bladder wall thickening, which may reflect underdistention. Underlying cystitis not excluded. 3. Small right pleural effusion with diffuse interstitial thickening and extensive patchy ground-glass and airspace opacities in the imaged lower lungs, which can be seen with infection and/or edema. Electronically signed by: Waddell Calk MD 10/11/2024 11:45 AM EST RP Workstation: [id number]   DG Chest 1 View Result Date: 10/11/2024 EXAM: 1 VIEW(S) XRAY OF THE CHEST 10/11/2024 07:01:15 AM COMPARISON: 10/10/2024 CLINICAL HISTORY: CHF (congestive heart failure) (HCC) FINDINGS: LUNGS AND PLEURA: Low lung volumes. Mild interstitial edema with increased peripheral septal markings, decreased from previous exam. Patchy airspace opacities throughout both lungs, right greater than left, unchanged. No pleural effusion. No pneumothorax. HEART AND MEDIASTINUM: Aortic atherosclerosis. No  acute abnormality of the cardiac silhouette. BONES AND SOFT TISSUES: No acute osseous abnormality. IMPRESSION: 1. Mild interstitial edema with increased peripheral septal  markings, decreased from previous exam. 2. Patchy airspace opacities throughout both lungs, right greater than left, unchanged. 3. Aortic atherosclerosis. Electronically signed by: Waddell Calk MD 10/11/2024 07:08 AM EST RP Workstation: [id number]   US  Venous Img Lower Unilateral Right Result Date: 10/10/2024 CLINICAL DATA:  Right lower extremity edema EXAM: RIGHT LOWER EXTREMITY VENOUS DOPPLER ULTRASOUND TECHNIQUE: Gray-scale sonography with compression, as well as color and duplex ultrasound, were performed to evaluate the deep venous system(s) from the level of the common femoral vein through the popliteal and proximal calf veins. COMPARISON:  None Available. FINDINGS: VENOUS Normal compressibility of the common femoral, superficial femoral, and popliteal veins, as well as the visualized calf veins. Visualized portions of profunda femoral vein and great saphenous vein unremarkable. No filling defects to suggest DVT on grayscale or color Doppler imaging. Doppler waveforms show normal direction of venous flow, normal respiratory plasticity and response to augmentation. Limited views of the contralateral common femoral vein are unremarkable. OTHER None. Limitations: none IMPRESSION: Negative. Electronically Signed   By: Wilkie Lent M.D.   On: 10/10/2024 16:08   DG Chest 2 View Result Date: 10/10/2024 EXAM: 2 VIEW(S) XRAY OF THE CHEST 10/10/2024 02:04:26 PM COMPARISON: 06/28/2015. CLINICAL HISTORY: SOB SOB FINDINGS: LUNGS AND PLEURA: Shallow inspiration. There are coarse linear infiltrates in the mid and lower lungs, developing since the prior study. This may represent atelectasis, aspiration, or multifocal pneumonia. The pattern is less likely to represent edema. No pleural effusion or pneumothorax. HEART AND MEDIASTINUM: Heart size is normal. Mediastinal contours appear intact. Calcification of the aorta. BONES AND SOFT TISSUES: Degenerative changes in the spine and shoulders. IMPRESSION: 1. Coarse  linear opacities in the mid and lower lungs, new from the prior exam, most consistent with atelectasis, aspiration, or multifocal pneumonia; edema is less likely. Electronically signed by: Elsie Gravely MD 10/10/2024 02:22 PM EST RP Workstation: [id number]        Scheduled Meds:  apixaban   2.5 mg Oral BID   furosemide   40 mg Intravenous BID   levothyroxine   88 mcg Oral Q0600   losartan   100 mg Oral Daily   metoprolol  tartrate  12.5 mg Oral BID   sodium chloride  flush  3 mL Intravenous Q12H   tamsulosin   0.4 mg Oral Daily   Continuous Infusions:  sodium chloride        LOS: 1 day     Calvin KATHEE Robson, MD [facility]   If 7PM-7AM, please contact night-coverage  10/11/2024, 2:05 PM

## 2024-10-11 NOTE — Evaluation (Signed)
 Clinical/Bedside Swallow Evaluation Patient Details  Name: Bruce Zavala MRN: [id number] Date of Birth: 1926/02/15  Today's Date: 10/11/2024 Time: SLP Start Time (ACUTE ONLY): 1340 SLP Stop Time (ACUTE ONLY): 1440 SLP Time Calculation (min) (ACUTE ONLY): 60 min  Past Medical History:  Past Medical History:  Diagnosis Date   Hypertension    Thyroid  disease    Past Surgical History: History reviewed. No pertinent surgical history. HPI:  Pt is a 89 y.o. male with medical history significant of HTN, hypothyroidism, who presented with worsening of shortness of breath and ankle edema.  Daughter at bedside gave history.  Patient has been having increasing exertional dyspnea for 2 weeks.  Along with increasing bilateral ankle edema. There was no cough associated but daughter did mention that patient does have cough or choking after eating solid food  for several months- this along w/ dyspnea was reported to his PCP in 05/2024 per chart.   CXR: Mild interstitial edema with increased peripheral septal markings, decreased  from previous exam.  2. Patchy airspace opacities throughout both lungs, right greater than left,  unchanged.    Assessment / Plan / Recommendation  Clinical Impression   Pt seen for BSE. Daughter present. Pt A/O x3; engaged easily. Pt awake, verbal and pleasant. He is Spanish-speaking and HOH w/out HAs. Daughter stated it's best for her to talk/interpret w/ him b/c he cannot hear the Interpreter on the video. NSG endorsed this as well. Pt helped to sit upright in bed for po intake. Pt is 89yo. On [place] O2 support- 2L; afebrile, WBC not elevated.    OF NOTE: Pt has endorsed s/s of Esophageal phase Dysmotility at his PCP office in 05/2024 per chart and s/s of REFLUX at home, especially when eating meats. Pt described a Globus sensation and Belching; trouble eating meats.    Pt appears to present w/ functional oropharyngeal phase swallowing w/ No overt, oropharyngeal phase  dysphagia appreciated during oral intake of trials; No neuromuscular swallowing deficits appreciated. Pt appears at reduced risk for aspiration from an oropharyngeal phase standpoint when following general aspiration precautions. 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 ANY Esophageal phase Dysmotility or Regurgitation of REFLUX material can increase risk for aspiration of the REFLUX material during Retrograde flow thus impact Pulmonary status.     Pt sat upright in bed and consumed several trials of thin liquids Via Cup, purees, and moistened solid foods feeding self w/ setup. No overt, clinical s/s of aspiration noted; clear vocal quality b/t trials, no decline in pulmonary status, no decline in O2 sats(98%), no coughing during continued trials. Oral phase appeared [facility] for bolus management and timely A-P transfer/clearing of material. Mastication appropriate for moistened solid boluses. OM exam was [facility] for oral clearing; lingual/labial movements. No unilateral lingual/labial weakness. Strong cough. Speech clear.    Recommend a more Mech Soft diet consistency (cut, moistened foods) w/ thin liquids via Cup(no straw use d/t air swallowed). General aspiration precautions including sitting upright for po intake, small bites/sips slowly. Rest Breaks during meals/oral intake to allow for Esophageal clearing; alternate food and liquids. REFLUX precautions recommended - HOB elevated at night when sleeping and for ~1 hour post meals.   Recommend pt f/u w/ GI for assessment/education if further needs. Discussion was had on general REFLUX and Esophageal phase Dysmotility, impact of advanced age-Esophageal motility and the impact of Dysmotility, behaviors to manage REFLUX, and foods/diet Prep/Options.  MD updated  and will reconsult ST services if any new needs while admitted. NSG updated. Pt and Daughter appreciative of Education  information. 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 No straws.) = a more Mech Soft diet consistency (cut, moistened foods) w/ thin liquids via Cup(no straw use d/t air swallowed). General aspiration precautions including sitting upright for po intake, small bites/sips slowly. Rest Breaks during meals/oral intake to allow for Esophageal clearing; alternate food and liquids. REFLUX precautions recommended - HOB elevated at night when sleeping and for ~1 hour post meals.  Medication Administration: Whole meds with puree    Other Recommendations Recommended Consults: Consider GI evaluation;Consider esophageal assessment (if indicated) Oral Care Recommendations: Oral care BID;Oral care before and after PO;Staff/trained caregiver to provide oral care (support Denture care)     Swallow Evaluation Recommendations  See above   Assistance Recommended at Discharge  PRN at meals  Functional Status Assessment Patient has not had a recent decline in their functional status  Frequency and Duration  (n/a)   (n/a)       Prognosis Prognosis for improved oropharyngeal function: Fair (-Good) Barriers to Reach Goals: Time post onset;Severity of deficits Barriers/Prognosis Comment: 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      Swallow Study   General Date of Onset: 10/10/24 HPI: Pt is a 89 y.o. male with medical history significant of HTN, hypothyroidism, who presented with worsening of shortness of breath and ankle edema.     Daughter at bedside gave history.  Patient has been having increasing exertional dyspnea for 2 weeks.  Along with increasing bilateral ankle edema. There was no cough  associated but daughter did mention that patient does have cough or choking after eating solid food  for several months- this along w/ dyspnea was reported to his PCP in 05/2024 per chart.   CXR: Mild interstitial edema with increased peripheral septal markings, decreased  from previous exam.  2. Patchy airspace opacities throughout both lungs, right greater than left,  unchanged. Type of Study: Bedside Swallow Evaluation Previous Swallow Assessment: none Diet Prior to this Study: Regular;Thin liquids (Level 0) (SOFT per MD) Temperature Spikes Noted: No (wbc 5.9) Respiratory Status: Nasal cannula (2L) History of Recent Intubation: No Behavior/Cognition: Alert;Cooperative;Pleasant mood (conversed easily w/ Dtr and this SLP - speech appropriate) Oral Cavity Assessment: Within Functional Limits Oral Care Completed by SLP: Recent completion by staff Oral Cavity - Dentition: Dentures, top;Dentures, bottom (in place) Vision: Functional for self-feeding Self-Feeding Abilities: Able to feed self;Needs set up Patient Positioning: Upright in bed (helped to sit upright) Baseline Vocal Quality: Normal Volitional Cough: Strong Volitional Swallow: Able to elicit    Oral/Motor/Sensory Function Overall Oral Motor/Sensory Function: Within functional limits   Ice Chips Ice chips: Not tested   Thin Liquid Thin Liquid: Within functional limits Presentation: Cup;Self Fed (12 trials) Other Comments: mild cough on first swallow but none thereafter    Nectar Thick Nectar Thick Liquid: Not tested   Honey Thick Honey Thick Liquid: Not tested   Puree Puree: Within functional limits Presentation: Self Fed;Spoon (5+ trials)   Solid     Solid: Within functional limits (moistened) Presentation: Self Fed;Spoon (5+ trials)        Comer Portugal, MS, CCC-SLP  Speech Language Pathologist Rehab Services; [facility] [place] [phone] (ascom) Lonnell Chaput 10/11/2024,4:38 PM

## 2024-10-11 NOTE — Evaluation (Signed)
 Occupational Therapy Evaluation Patient Details Name: Bruce Zavala MRN: [id number] DOB: 07/05/1926 Today's Date: 10/11/2024   History of Present Illness   89 y.o. male with medical history significant of HTN, hypothyroidism, presented with worsening of shortness of breath and ankle edema.     Clinical Impressions Patient presenting with decreased Ind in self care,balance, functional mobility, transfers, endurance, and safety awareness. Patient reports being Mod I with use of SPB at baseline and living with family. Family is able to assist as needed at discharge. Pt needing min guard to stand from EOB with RW. However, HR increased to 169 bpm with mobility to EOB and RR increased to 38 with mobility. Pt taking several side steps along EOB to the R with min guard before returning to sit on EOB. O2 saturation remains at 90% or higher during session while on 2Ls O2 via [place]. Patient continues to need medication intervention. Sit >supine with min A for LEs.  Patient will benefit from acute OT to increase overall independence in the areas of ADLs, functional mobility, and safety awareness in order to safely discharge.     If plan is discharge home, recommend the following:   A little help with walking and/or transfers;A little help with bathing/dressing/bathroom;Assistance with cooking/housework;Assist for transportation;Help with stairs or ramp for entrance     Functional Status Assessment   Patient has had a recent decline in their functional status and demonstrates the ability to make significant improvements in function in a reasonable and predictable amount of time.     Equipment Recommendations   None recommended by OT     Recommendations for Other Services         Precautions/Restrictions   Precautions Precautions: Fall     Mobility Bed Mobility Overal bed mobility: Needs Assistance Bed Mobility: Supine to Sit     Supine to sit: Supervision           Transfers Overall transfer level: Needs assistance Equipment used: Rolling walker (2 wheels) Transfers: Sit to/from Stand Sit to Stand: Contact guard assist                  Balance Overall balance assessment: Needs assistance Sitting-balance support: Feet supported Sitting balance-Leahy Scale: Good     Standing balance support: During functional activity, Bilateral upper extremity supported Standing balance-Leahy Scale: Fair                             ADL either performed or assessed with clinical judgement   ADL Overall ADL's : Needs assistance/impaired                         Toilet Transfer: Minimal assistance;Rolling walker (2 wheels) Toilet Transfer Details (indicate cue type and reason): simulated                 Vision Patient Visual Report: No change from baseline       Perception         Praxis         Pertinent Vitals/Pain Pain Assessment Pain Assessment: No/denies pain     Extremity/Trunk Assessment Upper Extremity Assessment Upper Extremity Assessment: Overall WFL for tasks assessed   Lower Extremity Assessment Lower Extremity Assessment: Overall WFL for tasks assessed       Communication Communication Communication: No apparent difficulties Factors Affecting Communication: Hearing impaired;Non - English speaking, interpreter not available   Cognition Arousal: Alert Behavior  During Therapy: WFL for tasks assessed/performed Cognition: No apparent impairments                               Following commands: Intact       Cueing  General Comments   Cueing Techniques: Verbal cues;Gestural cues  pt has good strength overall, but HR and RR are unstable currently interfering with safe mobility   Exercises     Shoulder Instructions      Home Living Family/patient expects to be discharged to:: Private residence Living Arrangements: Children;Spouse/significant other Available Help at  Discharge: Family;Available 24 hours/day Type of Home: House Home Access: Level entry;Other (comment)     Home Layout: One level     Bathroom Shower/Tub: Chief Strategy Officer: Standard Bathroom Accessibility: Yes   Home Equipment: Agricultural Consultant (2 wheels);Cane - single point;Shower seat;BSC/3in1          Prior Functioning/Environment Prior Level of Function : Independent/Modified Independent             Mobility Comments: Pt uses SPC and also furniture walks ADLs Comments: Pt washes himself and dresses himself. Family assists with bathing    OT Problem List: Decreased strength;Decreased activity tolerance;Decreased cognition;Impaired balance (sitting and/or standing);Decreased safety awareness   OT Treatment/Interventions: Self-care/ADL training;Balance training;Therapeutic exercise;Therapeutic activities;Energy conservation;Manual therapy;Patient/family education      OT Goals(Current goals can be found in the care plan section)   Acute Rehab OT Goals Patient Stated Goal: to go home once able to OT Goal Formulation: With patient Time For Goal Achievement: 10/25/24 Potential to Achieve Goals: Fair ADL Goals Pt Will Perform Grooming: with supervision;standing Pt Will Perform Lower Body Dressing: with supervision;sit to/from stand Pt Will Transfer to Toilet: with supervision;ambulating Pt Will Perform Toileting - Clothing Manipulation and hygiene: with supervision;sit to/from stand   OT Frequency:  Min 2X/week    Co-evaluation PT/OT/SLP Co-Evaluation/Treatment: Yes Reason for Co-Treatment: For patient/therapist safety;Complexity of the patient's impairments (multi-system involvement);To address functional/ADL transfers PT goals addressed during session: Mobility/safety with mobility OT goals addressed during session: ADL's and self-care      AM-PAC OT 6 Clicks Daily Activity     Outcome Measure Help from another person eating meals?:  None Help from another person taking care of personal grooming?: A Little Help from another person toileting, which includes using toliet, bedpan, or urinal?: A Little Help from another person bathing (including washing, rinsing, drying)?: A Little Help from another person to put on and taking off regular upper body clothing?: A Little Help from another person to put on and taking off regular lower body clothing?: A Lot 6 Click Score: 18   End of Session Equipment Utilized During Treatment: Rolling walker (2 wheels)  Activity Tolerance: Patient tolerated treatment well Patient left: in bed;with call bell/phone within reach;with family/visitor present  OT Visit Diagnosis: Unsteadiness on feet (R26.81);Repeated falls (R29.6);Muscle weakness (generalized) (M62.81)                Time: 8988-8963 OT Time Calculation (min): 25 min Charges:  OT General Charges $OT Visit: 1 Visit OT Evaluation $OT Eval Moderate Complexity: 1 [address] [place], OTR/L , CBIS ascom [phone]  10/11/2024, 11:53 AM

## 2024-10-11 NOTE — ED Notes (Signed)
 XR at bedside

## 2024-10-11 NOTE — Telephone Encounter (Signed)
 Pharmacy Patient Advocate Encounter  Insurance verification completed.    The patient is insured through [facility] MEDICAID.     Ran test claim for Eliquis  2.5mg  tablet and the current 30 day co-pay is $4.   This test claim was processed through [employer]- copay amounts may vary at other pharmacies due to [employer], or as the patient moves through the different stages of their insurance plan.

## 2024-10-12 DIAGNOSIS — I480 Paroxysmal atrial fibrillation: Secondary | ICD-10-CM | POA: Diagnosis not present

## 2024-10-12 LAB — ECHOCARDIOGRAM COMPLETE
Height: 67 in
S' Lateral: 3.3 cm
Weight: 2160 [oz_av]

## 2024-10-12 LAB — BASIC METABOLIC PANEL WITH GFR
Anion gap: 9 (ref 5–15)
BUN: 26 mg/dL — ABNORMAL HIGH (ref 8–23)
CO2: 31 mmol/L (ref 22–32)
Calcium: 9.2 mg/dL (ref 8.9–10.3)
Chloride: 102 mmol/L (ref 98–111)
Creatinine, Ser: 0.93 mg/dL (ref 0.61–1.24)
GFR, Estimated: >60 mL/min (ref >60)
Glucose, Bld: 118 mg/dL — ABNORMAL HIGH (ref 70–99)
Potassium: 3.4 mmol/L — ABNORMAL LOW (ref 3.5–5.1)
Sodium: 142 mmol/L (ref 135–145)

## 2024-10-12 MED ORDER — LOSARTAN POTASSIUM 50 MG PO TABS: 50.0000 mg | ORAL_TABLET | Freq: Every day | ORAL | Status: DC

## 2024-10-12 MED ORDER — POTASSIUM CHLORIDE CRYS ER 20 MEQ PO TBCR
40.0000 meq | EXTENDED_RELEASE_TABLET | Freq: Once | ORAL | Status: AC
  Administered 2024-10-12: 10:00:00 40 meq via ORAL
  Filled 2024-10-12: qty 2

## 2024-10-12 NOTE — ED Notes (Signed)
 Provider made aware of pt's soft blood pressures

## 2024-10-12 NOTE — ED Notes (Signed)
 Pt ambulatory with walker to the restroom and wishes that I leave him alone in same for privacy.

## 2024-10-12 NOTE — Progress Notes (Signed)
 PT Cancellation Note  Patient Details Name: Bruce Zavala MRN: [id number] DOB: 10-05-1926   Cancelled Treatment:     PT attempt. Pt currently eating lunch. Will return at a later time when pt is available to participate.    Rankin KATHEE Essex 10/12/2024, 1:08 PM

## 2024-10-12 NOTE — ED Notes (Signed)
 Messaged pharmacy for 10am Eliquis .

## 2024-10-12 NOTE — ED Notes (Signed)
 Went to give overdue Eliquis . Pt sleeping. Family member states she will let staff know when he wakes up.

## 2024-10-12 NOTE — Progress Notes (Signed)
 PROGRESS NOTE    Bruce Zavala  FMW:[id number] DOB: 06/01/1926 DOA: 10/10/2024 PCP: [name], PA-C    Brief Narrative:   89 y.o. male with medical history significant of HTN, hypothyroidism, presented with worsening of shortness of breath and ankle edema.   Daughter at bedside gave history.  Patient has been having increasing exertional dyspnea for 2 weeks.  Along with increasing bilateral ankle edema.  There was no cough associated but daughter did mention that patient does have cough or choking after eating solid food  for several months.  No chest pain no fever or chills.  Last few days even walking inside bedroom causing significant shortness of breath and patient also developed orthopnea woke up 3-4 times last night because of shortness of breath.   ED Course: Afebrile, heart rate 98-1 20s, in A-fib, blood pressure 108/55.  O2 saturation 88% on room air.  Chest x-ray showed multifocal pneumonia versus pulmonary congestion, blood work showed WBC 6.9 hemoglobin 15 BUN 23 creatinine 2.9 bicarb 27K 4.0.     Assessment & Plan:   Principal Problem:   Afib (HCC) Active Problems:   Aspiration pneumonia (HCC)   A-fib (HCC)   Acute on chronic diastolic CHF (congestive heart failure) (HCC)   Acute decompensated heart failure (HCC)  Acute, probably on chronic HF decompensation, diastolic dysfunction suspected. Suspect this is related to new onset A-fib with RVR Plan: Continue IV diuresis Strict ins and outs, daily weights 2D echocardiogram, completed pending read Cardiology consultation based on echo findings   A-fib with RVR Patient was given digoxin  x 1 Currently in sinus rhythm Plan: Continue metoprolol  12.5 mg twice daily As needed Lopressor  for rate control Eliquis  for anticoagulation.  CHA2DS2-VASc is 2 Inpatient cardiology consult versus outpatient follow-up   Dysphagia Chronic aspiration Aspiration pneumonia, ruled out.  No evidence of infectious  process. Plan: Hold antibiotics Aspiration precautions  HTN Continue losartan  Continue metoprolol    Hypothyroidism TSH within normal limits - Continue Synthroid    Deconditioning Request PT evaluation Home health PT and OT ordered at the time of discharge     DVT prophylaxis: Eliquis  Code Status: DNR Family Communication: Granddaughter and daughter at bedside, daughter at bedside 12/17 Disposition Plan: Status is: Inpatient Remains inpatient appropriate because: New onset CHF, new onset atrial fibrillation   Level of care: Telemetry  Consultants:  None  Procedures:  None  Antimicrobials: None   Subjective: Seen and went.  History conducted in Spanish.  Patient feels well overall.  No complaints  Objective: Vitals:   10/12/24 0538 10/12/24 0730 10/12/24 1030 10/12/24 1100  BP: 127/62 132/68 110/61 111/70  Pulse: 90 (!) 104 83 84  Resp: 18 20 18  (!) 25  Temp: (!) 97.4 F (36.3 C) 97.6 F (36.4 C)  97.7 F (36.5 C)  TempSrc: Oral Oral  Oral  SpO2: 100% 95% 93% 93%  Weight:      Height:        Intake/Output Summary (Last 24 hours) at 10/12/2024 1135 Last data filed at 10/12/2024 0933 Gross per 24 hour  Intake --  Output 800 ml  Net -800 ml   Filed Weights   10/10/24 1326  Weight: 61.2 kg    Examination:  General exam: In NAD Respiratory system: Mild crackles at bases.  Normal work of breathing.  Room air Cardiovascular system: S1-S2, RRR, no murmurs, trace pedal edema Gastrointestinal system: Soft, NT/ND, normal bowel sounds Central nervous system: Alert and oriented. No focal neurological deficits. Extremities: Symmetric 5 x  5 power. Skin: No rashes, lesions or ulcers Psychiatry: Judgement and insight appear normal. Mood & affect appropriate.     Data Reviewed: I have personally reviewed following labs and imaging studies  CBC: Recent Labs  Lab 10/10/24 1342  WBC 5.9  HGB 15.4  HCT 47.0  MCV 87.0  PLT 229   Basic Metabolic  Panel: Recent Labs  Lab 10/10/24 1342 10/11/24 0401 10/12/24 0236  NA 141 140 142  K 4.0 3.5 3.4*  CL 104 104 102  CO2 27 26 31   GLUCOSE 105* 97 118*  BUN 23 25* 26*  CREATININE 0.94 1.05 0.93  CALCIUM 9.4 8.4* 9.2   GFR: Estimated Creatinine Clearance: 40.2 mL/min (by C-G formula based on SCr of 0.93 mg/dL). Liver Function Tests: No results for input(s): AST, ALT, ALKPHOS, BILITOT, PROT, ALBUMIN in the last 168 hours. No results for input(s): LIPASE, AMYLASE in the last 168 hours. No results for input(s): AMMONIA in the last 168 hours. Coagulation Profile: No results for input(s): INR, PROTIME in the last 168 hours. Cardiac Enzymes: No results for input(s): CKTOTAL, CKMB, CKMBINDEX, TROPONINI in the last 168 hours. BNP (last 3 results) Recent Labs    10/10/24 1342  PROBNP 2,919.0*   HbA1C: No results for input(s): HGBA1C in the last 72 hours. CBG: No results for input(s): GLUCAP in the last 168 hours. Lipid Profile: No results for input(s): CHOL, HDL, LDLCALC, TRIG, CHOLHDL, LDLDIRECT in the last 72 hours. Thyroid  Function Tests: Recent Labs    10/10/24 1342  TSH 3.570   Anemia Panel: No results for input(s): VITAMINB12, FOLATE, FERRITIN, TIBC, IRON, RETICCTPCT in the last 72 hours. Sepsis Labs: Recent Labs  Lab 10/11/24 0515  PROCALCITON <0.10    Recent Results (from the past 240 hours)  Resp panel by RT-PCR (RSV, Flu A&B, Covid) Anterior Nasal Swab     Status: None   Collection Time: 10/10/24  1:50 PM   Specimen: Anterior Nasal Swab  Result Value Ref Range Status   SARS Coronavirus 2 by RT PCR NEGATIVE NEGATIVE Final    Comment: (NOTE) SARS-CoV-2 target nucleic acids are NOT DETECTED.  The SARS-CoV-2 RNA is generally detectable in upper respiratory specimens during the acute phase of infection. The lowest concentration of SARS-CoV-2 viral copies this assay can detect is 138 copies/mL. A  negative result does not preclude SARS-Cov-2 infection and should not be used as the sole basis for treatment or other patient management decisions. A negative result may occur with  improper specimen collection/handling, submission of specimen other than nasopharyngeal swab, presence of viral mutation(s) within the areas targeted by this assay, and inadequate number of viral copies(<138 copies/mL). A negative result must be combined with clinical observations, patient history, and epidemiological information. The expected result is Negative.  Fact Sheet for Patients:  bloggercourse.com  Fact Sheet for Healthcare Providers:  seriousbroker.it  This test is no t yet approved or cleared by the [place]  FDA and  has been authorized for detection and/or diagnosis of SARS-CoV-2 by FDA under an Emergency Use Authorization (EUA). This EUA will remain  in effect (meaning this test can be used) for the duration of the COVID-19 declaration under Section 564(b)(1) of the Act, 21 U.S.C.section 360bbb-3(b)(1), unless the authorization is terminated  or revoked sooner.       Influenza A by PCR NEGATIVE NEGATIVE Final   Influenza B by PCR NEGATIVE NEGATIVE Final    Comment: (NOTE) The Xpert Xpress SARS-CoV-2/FLU/RSV plus assay is intended as an aid  in the diagnosis of influenza from Nasopharyngeal swab specimens and should not be used as a sole basis for treatment. Nasal washings and aspirates are unacceptable for Xpert Xpress SARS-CoV-2/FLU/RSV testing.  Fact Sheet for Patients: bloggercourse.com  Fact Sheet for Healthcare Providers: seriousbroker.it  This test is not yet approved or cleared by the [place]  FDA and has been authorized for detection and/or diagnosis of SARS-CoV-2 by FDA under an Emergency Use Authorization (EUA). This EUA will remain in effect (meaning this test can  be used) for the duration of the COVID-19 declaration under Section 564(b)(1) of the Act, 21 U.S.C. section 360bbb-3(b)(1), unless the authorization is terminated or revoked.     Resp Syncytial Virus by PCR NEGATIVE NEGATIVE Final    Comment: (NOTE) Fact Sheet for Patients: bloggercourse.com  Fact Sheet for Healthcare Providers: seriousbroker.it  This test is not yet approved or cleared by the [place]  FDA and has been authorized for detection and/or diagnosis of SARS-CoV-2 by FDA under an Emergency Use Authorization (EUA). This EUA will remain in effect (meaning this test can be used) for the duration of the COVID-19 declaration under Section 564(b)(1) of the Act, 21 U.S.C. section 360bbb-3(b)(1), unless the authorization is terminated or revoked.  Performed at [facility], [address] [place], [place] [address]          Radiology Studies: CT RENAL STONE STUDY Result Date: 10/11/2024 EXAM: CT ABDOMEN AND PELVIS WITHOUT CONTRAST 10/11/2024 10:23:05 AM TECHNIQUE: CT of the abdomen and pelvis was performed without the administration of intravenous contrast. Multiplanar reformatted images are provided for review. Automated exposure control, iterative reconstruction, and/or weight-based adjustment of the mA/kV was utilized to reduce the radiation dose to as low as reasonably achievable. COMPARISON: None available. CLINICAL HISTORY: Abdominal/flank pain, stone suspected. FINDINGS: LOWER CHEST: Small right pleural effusion. Diffuse interstitial thickening with extensive patchy ground glass and airspace densities throughout the imaged portions of the lower lung zones. LIVER: The liver is unremarkable. GALLBLADDER AND BILE DUCTS: Gallbladder is unremarkable. No biliary ductal dilatation. SPLEEN: No acute abnormality. PANCREAS: No acute abnormality. ADRENAL GLANDS: No acute abnormality. KIDNEYS, URETERS AND BLADDER: No  stones in the kidneys or ureters. No hydronephrosis. No perinephric or periureteral stranding. Partially decompressed bladder with diffuse bladder wall thickening. Calcification along the undersurface of the dome of the bladder is noted. GI AND BOWEL: Stomach demonstrates no acute abnormality. The appendix is visualized and appears normal. Mild diffuse colonic stool burden. Colonic diverticula noted without signs of acute diverticulitis. There is no bowel obstruction. PERITONEUM AND RETROPERITONEUM: No ascites. No free air. No free fluid or fluid collections. No pneumoperitoneum. VASCULATURE: Aorta is normal in caliber. Aortic atherosclerosis. LYMPH NODES: No lymphadenopathy. No signs of abdominopelvic adenopathy. REPRODUCTIVE ORGANS: Mild prostate gland enlargement. BONES AND SOFT TISSUES: Thoracolumbar scoliosis deformity with multilevel lumbar spondylosis. No acute or suspicious osseous abnormality. Small fat-containing umbilical hernia. No focal soft tissue abnormality. IMPRESSION: 1. No acute findings in the abdomen or pelvis. 2. Bladder wall thickening, which may reflect underdistention. Underlying cystitis not excluded. 3. Small right pleural effusion with diffuse interstitial thickening and extensive patchy ground-glass and airspace opacities in the imaged lower lungs, which can be seen with infection and/or edema. Electronically signed by: Waddell Calk MD 10/11/2024 11:45 AM EST RP Workstation: [id number]   DG Chest 1 View Result Date: 10/11/2024 EXAM: 1 VIEW(S) XRAY OF THE CHEST 10/11/2024 07:01:15 AM COMPARISON: 10/10/2024 CLINICAL HISTORY: CHF (congestive heart failure) (HCC) FINDINGS: LUNGS AND PLEURA: Low lung volumes. Mild interstitial  edema with increased peripheral septal markings, decreased from previous exam. Patchy airspace opacities throughout both lungs, right greater than left, unchanged. No pleural effusion. No pneumothorax. HEART AND MEDIASTINUM: Aortic atherosclerosis. No acute  abnormality of the cardiac silhouette. BONES AND SOFT TISSUES: No acute osseous abnormality. IMPRESSION: 1. Mild interstitial edema with increased peripheral septal markings, decreased from previous exam. 2. Patchy airspace opacities throughout both lungs, right greater than left, unchanged. 3. Aortic atherosclerosis. Electronically signed by: Waddell Calk MD 10/11/2024 07:08 AM EST RP Workstation: [id number]   US  Venous Img Lower Unilateral Right Result Date: 10/10/2024 CLINICAL DATA:  Right lower extremity edema EXAM: RIGHT LOWER EXTREMITY VENOUS DOPPLER ULTRASOUND TECHNIQUE: Gray-scale sonography with compression, as well as color and duplex ultrasound, were performed to evaluate the deep venous system(s) from the level of the common femoral vein through the popliteal and proximal calf veins. COMPARISON:  None Available. FINDINGS: VENOUS Normal compressibility of the common femoral, superficial femoral, and popliteal veins, as well as the visualized calf veins. Visualized portions of profunda femoral vein and great saphenous vein unremarkable. No filling defects to suggest DVT on grayscale or color Doppler imaging. Doppler waveforms show normal direction of venous flow, normal respiratory plasticity and response to augmentation. Limited views of the contralateral common femoral vein are unremarkable. OTHER None. Limitations: none IMPRESSION: Negative. Electronically Signed   By: Wilkie Lent M.D.   On: 10/10/2024 16:08   DG Chest 2 View Result Date: 10/10/2024 EXAM: 2 VIEW(S) XRAY OF THE CHEST 10/10/2024 02:04:26 PM COMPARISON: 06/28/2015. CLINICAL HISTORY: SOB SOB FINDINGS: LUNGS AND PLEURA: Shallow inspiration. There are coarse linear infiltrates in the mid and lower lungs, developing since the prior study. This may represent atelectasis, aspiration, or multifocal pneumonia. The pattern is less likely to represent edema. No pleural effusion or pneumothorax. HEART AND MEDIASTINUM: Heart size is  normal. Mediastinal contours appear intact. Calcification of the aorta. BONES AND SOFT TISSUES: Degenerative changes in the spine and shoulders. IMPRESSION: 1. Coarse linear opacities in the mid and lower lungs, new from the prior exam, most consistent with atelectasis, aspiration, or multifocal pneumonia; edema is less likely. Electronically signed by: Elsie Gravely MD 10/10/2024 02:22 PM EST RP Workstation: [id number]        Scheduled Meds:  apixaban   2.5 mg Oral BID   furosemide   40 mg Intravenous BID   levothyroxine   88 mcg Oral Q0600   losartan   100 mg Oral Daily   metoprolol  tartrate  12.5 mg Oral BID   sodium chloride  flush  3 mL Intravenous Q12H   tamsulosin   0.4 mg Oral Daily   Continuous Infusions:     LOS: 2 days     Calvin KATHEE Robson, MD [facility]   If 7PM-7AM, please contact night-coverage  10/12/2024, 11:35 AM

## 2024-10-12 NOTE — Progress Notes (Signed)
 Physical Therapy Treatment Patient Details Name: Bruce Zavala MRN: [id number] DOB: Jul 29, 1926 Today's Date: 10/12/2024   History of Present Illness 89 y.o. male with medical history significant of HTN, hypothyroidism, presented with worsening of shortness of breath and ankle edema.    PT Comments  Pt was asleep in supine upon arrival. He easily awakes and is cooperative throughout. HOH and did not have hearing aides in. [name] present throughout session and very supportive. She is requesting some HH aide services if able after Dcing home. Pt was on 2 L o2 upon arrival. Sao2 > 96%. Removed O2 and pt was able to maintain > 90%. RN made aware post session. Encouraged daughter to monitor after DC. Pt's HR and RR elevate quickly however in A fib and inconsistent. Pt endorses no symptoms of distress or fatigue. RR peaked during gait at 42 bpm and HR 138 bpm. He walked ~ 120 ft without LOB. Encouraged pt to use RW at DC.  Dc recs remain appropriate, Acute PT will continue to follow per current POC.    If plan is discharge home, recommend the following: A little help with walking and/or transfers;Assistance with cooking/housework;Help with stairs or ramp for entrance;Assist for transportation;A little help with bathing/dressing/bathroom     Equipment Recommendations  Rolling walker (2 wheels)       Precautions / Restrictions Precautions Precautions: Fall Recall of Precautions/Restrictions: Intact Restrictions Weight Bearing Restrictions Per Provider Order: No     Mobility  Bed Mobility Overal bed mobility: Needs Assistance Bed Mobility: Supine to Sit, Sit to Supine  Supine to sit: Supervision Sit to supine: Supervision   Transfers Overall transfer level: Needs assistance Equipment used: Rolling walker (2 wheels) Transfers: Sit to/from Stand Sit to Stand: Supervision   Ambulation/Gait Ambulation/Gait assistance: Supervision Gait Distance (Feet): 120 Feet Assistive device:  Rolling walker (2 wheels) Gait Pattern/deviations: Step-through pattern, Trunk flexed Gait velocity: WNL  General Gait Details: Pt has elevated RR and HR throughout session however pt does not endortse any symptoms of distress. HR peaked at 138bpm with RR one short instance at 42 BPM . pt would have continued to ambulate if author had not elected to have pt return to room and rest. Supportive daughter presnt throughout session and continues to await results of echo    Balance Overall balance assessment: Needs assistance Sitting-balance support: Feet supported Sitting balance-Leahy Scale: Good     Standing balance support: During functional activity, Bilateral upper extremity supported Standing balance-Leahy Scale: Fair Standing balance comment: encouraged pt to use RW at DC for additional safety       Communication Communication Communication: No apparent difficulties Factors Affecting Communication: Hearing impaired;Non - English speaking, interpreter not available  Cognition Arousal: Alert Behavior During Therapy: WFL for tasks assessed/performed   PT - Cognitive impairments: No apparent impairments    PT - Cognition Comments: Pt is HOH but per daughter, at his baseline cognition Following commands: Intact      Cueing Cueing Techniques: Verbal cues, Tactile cues         Pertinent Vitals/Pain Pain Assessment Pain Assessment: No/denies pain     PT Goals (current goals can now be found in the care plan section) Acute Rehab PT Goals Patient Stated Goal: get better and go home Progress towards PT goals: Progressing toward goals    Frequency    Min 2X/week       Co-evaluation     PT goals addressed during session: Mobility/safety with mobility;Balance;Proper use of DME;Strengthening/ROM  AM-PAC PT 6 Clicks Mobility   Outcome Measure  Help needed turning from your back to your side while in a flat bed without using bedrails?: A Little Help needed  moving from lying on your back to sitting on the side of a flat bed without using bedrails?: A Little Help needed moving to and from a bed to a chair (including a wheelchair)?: A Little Help needed standing up from a chair using your arms (e.g., wheelchair or bedside chair)?: A Little Help needed to walk in hospital room?: A Little Help needed climbing 3-5 steps with a railing? : A Little 6 Click Score: 18    End of Session   Activity Tolerance: Other (comment) (RR/HR)   Nurse Communication: Mobility status PT Visit Diagnosis: Difficulty in walking, not elsewhere classified (R26.2)     Time: 8571-8554 PT Time Calculation (min) (ACUTE ONLY): 17 min  Charges:    $Gait Training: 8-22 mins PT General Charges $$ ACUTE PT VISIT: 1 Visit                     Rankin Essex PTA 10/12/2024, 3:34 PM

## 2024-10-12 NOTE — ED Notes (Signed)
 Meal given

## 2024-10-13 DIAGNOSIS — I480 Paroxysmal atrial fibrillation: Secondary | ICD-10-CM | POA: Diagnosis not present

## 2024-10-13 MED ORDER — METOPROLOL TARTRATE 25 MG PO TABS
25.0000 mg | ORAL_TABLET | Freq: Two times a day (BID) | ORAL | Status: DC
  Administered 2024-10-13: 10:00:00 25 mg via ORAL
  Filled 2024-10-13: qty 1

## 2024-10-13 MED ORDER — METOPROLOL TARTRATE 25 MG PO TABS: 25.0000 mg | ORAL_TABLET | Freq: Two times a day (BID) | ORAL | 0 refills | Status: DC

## 2024-10-13 MED ORDER — APIXABAN 2.5 MG PO TABS: 2.5000 mg | ORAL_TABLET | Freq: Two times a day (BID) | ORAL | 0 refills | Status: DC

## 2024-10-13 MED ORDER — FUROSEMIDE 10 MG/ML IJ SOLN
40.0000 mg | Freq: Every day | INTRAMUSCULAR | Status: DC
  Administered 2024-10-13: 10:00:00 40 mg via INTRAVENOUS
  Filled 2024-10-13: qty 4

## 2024-10-13 NOTE — Progress Notes (Signed)
° °  Brief Progress Note   _____________________________________________________________________________________________________________  Patient Name: Bruce Zavala Patient DOB: 1926-01-12 Date: @TODAY @      Data: Reviewed vital signs, labs, and clinical notes.    Action: No action required at this time.    Response:  Downgraded to tele.  _____________________________________________________________________________________________________________  [facility] RN Expeditor Willean Schurman S Norvel Wenker Please contact us  directly via secure chat (search for [facility]) or by calling us  at [phone] [facility]).

## 2024-10-13 NOTE — Progress Notes (Signed)
 Heart Failure Navigator Progress Note Assessed for Heart & Vascular TOC clinic readiness.  Patient with Afib-RVR, EF 50-55%.  Ambulatory follow-up with Cardiology.  Is being discharged from [facility] ED.  Navigator will sign off at this time.  Charmaine Pines, RN, BSN [facility] Heart Failure Navigator Secure Chat Only

## 2024-10-13 NOTE — Progress Notes (Signed)
° ° ° °  [facility] REH[facility]ILITATION SERVICES REFERRAL        Occupational Therapy * Physical Therapy * Speech Therapy                           DATE: 10/13/2024  PATIENT NAME: Bruce Zavala   PATIENT MRN: [id number]        DIAGNOSIS/DIAGNOSIS CODE: Heart Failure   DATE OF DISCHARGE: 10/13/2024       PRIMARY CARE PHYSICIAN:    Tinnie Pro   PCP PHONE/FAX: [phone]     Dear Provider (Name: [facility] outpatient __  Fax: [phone]   I certify that I have examined this patient and that occupational/physical/speech therapy is necessary on an outpatient basis.    The patient has expressed interest in completing their recommended course of therapy at your  location.  Once a formal order from the patient's primary care physician has been obtained, please  contact him/her to schedule an appointment for evaluation at your earliest convenience.   [ x]  Physical Therapy Evaluate and Treat  [  x]  Occupational Therapy Evaluate and Treat  [  ]  Speech Therapy Evaluate and Treat         The patient's primary care physician (listed above) must furnish and be responsible for a formal order such that the recommended services may be furnished while under the primary physician's care, and that the plan of care will be established and reviewed every 30 days (or more often if condition necessitates).

## 2024-10-13 NOTE — Discharge Summary (Signed)
 Physician Discharge Summary  [facility] Bruce Zavala:[id number] DOB: 23-May-1926 DOA: 10/10/2024  PCP: Bruce Tinnie POUR, PA-C  Admit date: 10/10/2024 Discharge date: 10/13/2024  Admitted From: Home Disposition:  Home with home health  Recommendations for Outpatient Follow-up:  Follow up with PCP in 1-2 weeks Ambulatory referral to cardiology  Home Health: Yes PT and OT Equipment/Devices: None  Discharge Condition: Stable CODE STATUS: DNR limited Diet recommendation: Heart healthy  Brief/Interim Summary: HTN, hypothyroidism, presented with worsening of shortness of breath and ankle edema.   Daughter at bedside gave history.  Patient has been having increasing exertional dyspnea for 2 weeks.  Along with increasing bilateral ankle edema.  There was no cough associated but daughter did mention that patient does have cough or choking after eating solid food  for several months.  No chest pain no fever or chills.  Last few days even walking inside bedroom causing significant shortness of breath and patient also developed orthopnea woke up 3-4 times last night because of shortness of breath.   ED Course: Afebrile, heart rate 98-1 20s, in A-fib, blood pressure 108/55.  O2 saturation 88% on room air.  Chest x-ray showed multifocal pneumonia versus pulmonary congestion, blood work showed WBC 6.9 hemoglobin 15 BUN 23 creatinine 2.9 bicarb 27K 4.0.    Discharge Diagnoses:  Principal Problem:   Afib (HCC) Active Problems:   Aspiration pneumonia (HCC)   A-fib (HCC)   Acute on chronic diastolic CHF (congestive heart failure) (HCC)   Acute decompensated heart failure (HCC) Acute, probably on chronic HF decompensation, diastolic dysfunction suspected. Suspect this is related to new onset A-fib with RVR Interestingly echocardiogram with ejection fraction 50 to 55% no mention of diastolic dysfunction Plan: At time of discharge volume status optimized.  Considering reassuring echocardiogram and  borderline blood pressures at time of discharge will not initiate standing diuretic therapy.  Ambulatory referral placed to cardiology to discuss the risk versus benefits of ongoing diuretic therapy  A-fib with RVR, suspect paroxysmal Patient was given digoxin  x 1 Patient has alternated between sinus rhythm and atrial fibrillation Plan: At time of discharge will recommend metoprolol  25 mg twice daily.  Initiate low-dose Eliquis  for anticoagulation.  CHA2DS2-VASc or of 2.  Hold home losartan .  Ambulatory referral to cardiology as outpatient.  Dysphagia Chronic aspiration Aspiration pneumonia, ruled out.  No evidence of infectious process. Seen by SLP.  On dysphagia 3 diet   HTN Discontinue losartan  Continue metoprolol    Hypothyroidism TSH within normal limits - Continue Synthroid    Deconditioning Request PT evaluation Home health PT and OT ordered at the time of discharge Unfortunately patient's [place] is a barrier and home health cannot be arranged at time of discharge   Discharge Instructions  Discharge Instructions     Ambulatory referral to Cardiology   Complete by: As directed    Increase activity slowly   Complete by: As directed       Allergies as of 10/13/2024       Reactions   Penicillin G Itching, Shortness Of Breath, Swelling   Penicillins Other (See Comments)   unk   Ceftriaxone  Swelling   Upper lip swelled up after ceftriaxone  infusion, no tongue swelling or throat swelling        Medication List     STOP taking these medications    ibuprofen  600 MG tablet Commonly known as: ADVIL    losartan  100 MG tablet Commonly known as: COZAAR        TAKE these medications  apixaban  2.5 MG Tabs tablet Commonly known as: ELIQUIS  Take 1 tablet (2.5 mg total) by mouth 2 (two) times daily.   levothyroxine  88 MCG tablet Commonly known as: SYNTHROID  Take 88 mcg by mouth daily.   metoprolol  tartrate 25 MG tablet Commonly known as:  LOPRESSOR  Take 1 tablet (25 mg total) by mouth 2 (two) times daily.        Follow-up Information     McDonough, Tinnie POUR, PA-C. Schedule an appointment as soon as possible for a visit in 1 week(s).   Specialty: Physician Assistant Contact information: [phone] [address] [place] [address] [phone]                Allergies[1]  Consultations: None   Procedures/Studies: ECHOCARDIOGRAM COMPLETE Result Date: 10/12/2024    ECHOCARDIOGRAM REPORT   [name] Date of Exam: 10/11/2024 Medical Rec #:  [id number]        Height:       67.0 in Accession #:    [id number]       Weight:       135.0 lb Date of Birth:  Jan 30, 1926        BSA:          1.711 m Patient Age:    89 years         BP:           128/70 mmHg Patient Gender: M                HR:           116 bpm. Exam Location:  [facility] Procedure: 2D Echo, Cardiac Doppler and Color Doppler (Both Spectral and Color            Flow Doppler were utilized during procedure). Indications:     I50.31 Acute Diastolic CHF  History:         Patient has no prior history of Echocardiogram examinations.                  Risk Factors:Hypertension.  Sonographer:     Carl Coma RDCS Referring Phys:  [id number] CORT ONEIDA MANA Diagnosing Phys: Dwayne D Callwood MD IMPRESSIONS  1. TDS.  2. Left ventricular ejection fraction, by estimation, is 50 to 55%. The left ventricle has low normal function. The left ventricle has no regional wall motion abnormalities. Left ventricular diastolic function could not be evaluated.  3. Right ventricular systolic function is normal. The right ventricular size is normal.  4. The mitral valve is grossly normal. Mild mitral valve regurgitation.  5. The aortic valve is calcified. Aortic valve regurgitation is mild. Aortic valve sclerosis/calcification is present, without any evidence of aortic stenosis. FINDINGS  Left Ventricle: Left ventricular ejection fraction, by estimation, is 50 to 55%. The left  ventricle has low normal function. The left ventricle has no regional wall motion abnormalities. Strain was performed and the global longitudinal strain is indeterminate. The left ventricular internal cavity size was normal in size. There is no left ventricular hypertrophy. Left ventricular diastolic function could not be evaluated. Right Ventricle: The right ventricular size is normal. No increase in right ventricular wall thickness. Right ventricular systolic function is normal. Left Atrium: Left atrial size was normal in size. Right Atrium: Right atrial size was normal in size. Pericardium: There is no evidence of pericardial effusion. Mitral Valve: The mitral valve is grossly normal. Mild mitral valve regurgitation. Tricuspid Valve: The tricuspid valve is normal in structure. Tricuspid valve  regurgitation is trivial. Aortic Valve: The aortic valve is calcified. Aortic valve regurgitation is mild. Aortic valve sclerosis/calcification is present, without any evidence of aortic stenosis. Pulmonic Valve: The pulmonic valve was not well visualized. Pulmonic valve regurgitation is not visualized. Aorta: The ascending aorta was not well visualized. IAS/Shunts: No atrial level shunt detected by color flow Doppler. Additional Comments: TDS. 3D was performed not requiring image post processing on an independent workstation and was indeterminate.  LEFT VENTRICLE PLAX 2D LVIDd:         4.40 cm LVIDs:         3.30 cm LV PW:         0.80 cm LV IVS:        0.90 cm LVOT diam:     2.20 cm LV SV:         51 LV SV Index:   30 LVOT Area:     3.80 cm  RIGHT VENTRICLE            IVC RV Basal diam:  3.40 cm    IVC diam: 1.70 cm RV S prime:     9.50 cm/s TAPSE (M-mode): 1.6 cm LEFT ATRIUM             Index        RIGHT ATRIUM           Index LA diam:        4.00 cm 2.34 cm/m   RA Area:     10.10 cm LA Vol (A2C):   33.0 ml 19.29 ml/m  RA Volume:   19.10 ml  11.16 ml/m LA Vol (A4C):   46.6 ml 27.23 ml/m LA Biplane Vol: 40.9 ml  23.90 ml/m  AORTIC VALVE LVOT Vmax:   89.40 cm/s LVOT Vmean:  61.900 cm/s LVOT VTI:    0.134 m  AORTA Ao Root diam: 3.50 cm Ao Asc diam:  3.20 cm MV E velocity: 114.25 cm/s  TRICUSPID VALVE                             TR Peak grad:   28.1 mmHg                             TR Vmax:        265.00 cm/s                              SHUNTS                             Systemic VTI:  0.13 m                             Systemic Diam: 2.20 cm Cara JONETTA Lovelace MD Electronically signed by Cara JONETTA Lovelace MD Signature Date/Time: 10/12/2024/12:22:42 PM    Final    CT RENAL STONE STUDY Result Date: 10/11/2024 EXAM: CT ABDOMEN AND PELVIS WITHOUT CONTRAST 10/11/2024 10:23:05 AM TECHNIQUE: CT of the abdomen and pelvis was performed without the administration of intravenous contrast. Multiplanar reformatted images are provided for review. Automated exposure control, iterative reconstruction, and/or weight-based adjustment of the mA/kV was utilized to reduce the radiation dose to as low as reasonably achievable. COMPARISON: None available. CLINICAL HISTORY: Abdominal/flank pain, stone suspected.  FINDINGS: LOWER CHEST: Small right pleural effusion. Diffuse interstitial thickening with extensive patchy ground glass and airspace densities throughout the imaged portions of the lower lung zones. LIVER: The liver is unremarkable. GALLBLADDER AND BILE DUCTS: Gallbladder is unremarkable. No biliary ductal dilatation. SPLEEN: No acute abnormality. PANCREAS: No acute abnormality. ADRENAL GLANDS: No acute abnormality. KIDNEYS, URETERS AND BLADDER: No stones in the kidneys or ureters. No hydronephrosis. No perinephric or periureteral stranding. Partially decompressed bladder with diffuse bladder wall thickening. Calcification along the undersurface of the dome of the bladder is noted. GI AND BOWEL: Stomach demonstrates no acute abnormality. The appendix is visualized and appears normal. Mild diffuse colonic stool burden. Colonic  diverticula noted without signs of acute diverticulitis. There is no bowel obstruction. PERITONEUM AND RETROPERITONEUM: No ascites. No free air. No free fluid or fluid collections. No pneumoperitoneum. VASCULATURE: Aorta is normal in caliber. Aortic atherosclerosis. LYMPH NODES: No lymphadenopathy. No signs of abdominopelvic adenopathy. REPRODUCTIVE ORGANS: Mild prostate gland enlargement. BONES AND SOFT TISSUES: Thoracolumbar scoliosis deformity with multilevel lumbar spondylosis. No acute or suspicious osseous abnormality. Small fat-containing umbilical hernia. No focal soft tissue abnormality. IMPRESSION: 1. No acute findings in the abdomen or pelvis. 2. Bladder wall thickening, which may reflect underdistention. Underlying cystitis not excluded. 3. Small right pleural effusion with diffuse interstitial thickening and extensive patchy ground-glass and airspace opacities in the imaged lower lungs, which can be seen with infection and/or edema. Electronically signed by: Waddell Calk MD 10/11/2024 11:45 AM EST RP Workstation: [id number]   DG Chest 1 View Result Date: 10/11/2024 EXAM: 1 VIEW(S) XRAY OF THE CHEST 10/11/2024 07:01:15 AM COMPARISON: 10/10/2024 CLINICAL HISTORY: CHF (congestive heart failure) (HCC) FINDINGS: LUNGS AND PLEURA: Low lung volumes. Mild interstitial edema with increased peripheral septal markings, decreased from previous exam. Patchy airspace opacities throughout both lungs, right greater than left, unchanged. No pleural effusion. No pneumothorax. HEART AND MEDIASTINUM: Aortic atherosclerosis. No acute abnormality of the cardiac silhouette. BONES AND SOFT TISSUES: No acute osseous abnormality. IMPRESSION: 1. Mild interstitial edema with increased peripheral septal markings, decreased from previous exam. 2. Patchy airspace opacities throughout both lungs, right greater than left, unchanged. 3. Aortic atherosclerosis. Electronically signed by: Waddell Calk MD 10/11/2024 07:08 AM EST RP  Workstation: [id number]   US  Venous Img Lower Unilateral Right Result Date: 10/10/2024 CLINICAL DATA:  Right lower extremity edema EXAM: RIGHT LOWER EXTREMITY VENOUS DOPPLER ULTRASOUND TECHNIQUE: Gray-scale sonography with compression, as well as color and duplex ultrasound, were performed to evaluate the deep venous system(s) from the level of the common femoral vein through the popliteal and proximal calf veins. COMPARISON:  None Available. FINDINGS: VENOUS Normal compressibility of the common femoral, superficial femoral, and popliteal veins, as well as the visualized calf veins. Visualized portions of profunda femoral vein and great saphenous vein unremarkable. No filling defects to suggest DVT on grayscale or color Doppler imaging. Doppler waveforms show normal direction of venous flow, normal respiratory plasticity and response to augmentation. Limited views of the contralateral common femoral vein are unremarkable. OTHER None. Limitations: none IMPRESSION: Negative. Electronically Signed   By: Wilkie Lent M.D.   On: 10/10/2024 16:08   DG Chest 2 View Result Date: 10/10/2024 EXAM: 2 VIEW(S) XRAY OF THE CHEST 10/10/2024 02:04:26 PM COMPARISON: 06/28/2015. CLINICAL HISTORY: SOB SOB FINDINGS: LUNGS AND PLEURA: Shallow inspiration. There are coarse linear infiltrates in the mid and lower lungs, developing since the prior study. This may represent atelectasis, aspiration, or multifocal pneumonia. The pattern is less likely to represent edema. No pleural  effusion or pneumothorax. HEART AND MEDIASTINUM: Heart size is normal. Mediastinal contours appear intact. Calcification of the aorta. BONES AND SOFT TISSUES: Degenerative changes in the spine and shoulders. IMPRESSION: 1. Coarse linear opacities in the mid and lower lungs, new from the prior exam, most consistent with atelectasis, aspiration, or multifocal pneumonia; edema is less likely. Electronically signed by: Elsie Gravely MD 10/10/2024 02:22  PM EST RP Workstation: [id number]      Subjective: Seen and examined on the day of discharge.  Stable no distress.  Granddaughter at bedside at time of my evaluation.  Patient stable for discharge home.  Discharge Exam: Vitals:   10/13/24 0730 10/13/24 0800  BP: (!) 96/59 101/65  Pulse: 83 (!) 105  Resp: 18 17  Temp:  98.2 F (36.8 C)  SpO2: 93% 94%   Vitals:   10/13/24 0056 10/13/24 0500 10/13/24 0730 10/13/24 0800  BP: 116/80 103/61 (!) 96/59 101/65  Pulse: 89 84 83 (!) 105  Resp: 15 16 18 17   Temp:  98.1 F (36.7 C)  98.2 F (36.8 C)  TempSrc:    Oral  SpO2: 100% 92% 93% 94%  Weight:      Height:        General: Pt is alert, awake, not in acute distress Cardiovascular: RRR, S1/S2 +, no rubs, no gallops Respiratory: CTA bilaterally, no wheezing, no rhonchi Abdominal: Soft, NT, ND, bowel sounds + Extremities: no edema, no cyanosis    The results of significant diagnostics from this hospitalization (including imaging, microbiology, ancillary and laboratory) are listed below for reference.     Microbiology: Recent Results (from the past 240 hours)  Resp panel by RT-PCR (RSV, Flu A&B, Covid) Anterior Nasal Swab     Status: None   Collection Time: 10/10/24  1:50 PM   Specimen: Anterior Nasal Swab  Result Value Ref Range Status   SARS Coronavirus 2 by RT PCR NEGATIVE NEGATIVE Final    Comment: (NOTE) SARS-CoV-2 target nucleic acids are NOT DETECTED.  The SARS-CoV-2 RNA is generally detectable in upper respiratory specimens during the acute phase of infection. The lowest concentration of SARS-CoV-2 viral copies this assay can detect is 138 copies/mL. A negative result does not preclude SARS-Cov-2 infection and should not be used as the sole basis for treatment or other patient management decisions. A negative result may occur with  improper specimen collection/handling, submission of specimen other than nasopharyngeal swab, presence of viral mutation(s) within  the areas targeted by this assay, and inadequate number of viral copies(<138 copies/mL). A negative result must be combined with clinical observations, patient history, and epidemiological information. The expected result is Negative.  Fact Sheet for Patients:  bloggercourse.com  Fact Sheet for Healthcare Providers:  seriousbroker.it  This test is no t yet approved or cleared by the [place]  FDA and  has been authorized for detection and/or diagnosis of SARS-CoV-2 by FDA under an Emergency Use Authorization (EUA). This EUA will remain  in effect (meaning this test can be used) for the duration of the COVID-19 declaration under Section 564(b)(1) of the Act, 21 U.S.C.section 360bbb-3(b)(1), unless the authorization is terminated  or revoked sooner.       Influenza A by PCR NEGATIVE NEGATIVE Final   Influenza B by PCR NEGATIVE NEGATIVE Final    Comment: (NOTE) The Xpert Xpress SARS-CoV-2/FLU/RSV plus assay is intended as an aid in the diagnosis of influenza from Nasopharyngeal swab specimens and should not be used as a sole basis for treatment. Nasal washings and  aspirates are unacceptable for Xpert Xpress SARS-CoV-2/FLU/RSV testing.  Fact Sheet for Patients: bloggercourse.com  Fact Sheet for Healthcare Providers: seriousbroker.it  This test is not yet approved or cleared by the [place]  FDA and has been authorized for detection and/or diagnosis of SARS-CoV-2 by FDA under an Emergency Use Authorization (EUA). This EUA will remain in effect (meaning this test can be used) for the duration of the COVID-19 declaration under Section 564(b)(1) of the Act, 21 U.S.C. section 360bbb-3(b)(1), unless the authorization is terminated or revoked.     Resp Syncytial Virus by PCR NEGATIVE NEGATIVE Final    Comment: (NOTE) Fact Sheet for  Patients: bloggercourse.com  Fact Sheet for Healthcare Providers: seriousbroker.it  This test is not yet approved or cleared by the [place]  FDA and has been authorized for detection and/or diagnosis of SARS-CoV-2 by FDA under an Emergency Use Authorization (EUA). This EUA will remain in effect (meaning this test can be used) for the duration of the COVID-19 declaration under Section 564(b)(1) of the Act, 21 U.S.C. section 360bbb-3(b)(1), unless the authorization is terminated or revoked.  Performed at [facility], [address] [place], [place] [address]      Labs: BNP (last 3 results) No results for input(s): BNP in the last 8760 hours. Basic Metabolic Panel: Recent Labs  Lab 10/10/24 1342 10/11/24 0401 10/12/24 0236  NA 141 140 142  K 4.0 3.5 3.4*  CL 104 104 102  CO2 27 26 31   GLUCOSE 105* 97 118*  BUN 23 25* 26*  CREATININE 0.94 1.05 0.93  CALCIUM 9.4 8.4* 9.2   Liver Function Tests: No results for input(s): AST, ALT, ALKPHOS, BILITOT, PROT, ALBUMIN in the last 168 hours. No results for input(s): LIPASE, AMYLASE in the last 168 hours. No results for input(s): AMMONIA in the last 168 hours. CBC: Recent Labs  Lab 10/10/24 1342  WBC 5.9  HGB 15.4  HCT 47.0  MCV 87.0  PLT 229   Cardiac Enzymes: No results for input(s): CKTOTAL, CKMB, CKMBINDEX, TROPONINI in the last 168 hours. BNP: Invalid input(s): POCBNP CBG: No results for input(s): GLUCAP in the last 168 hours. D-Dimer No results for input(s): DDIMER in the last 72 hours. Hgb A1c No results for input(s): HGBA1C in the last 72 hours. Lipid Profile No results for input(s): CHOL, HDL, LDLCALC, TRIG, CHOLHDL, LDLDIRECT in the last 72 hours. Thyroid  function studies No results for input(s): TSH, T4TOTAL, T3FREE, THYROIDAB in the last 72 hours.  Invalid input(s): FREET3 Anemia  work up No results for input(s): VITAMINB12, FOLATE, FERRITIN, TIBC, IRON, RETICCTPCT in the last 72 hours. Urinalysis    Component Value Date/Time   COLORURINE YELLOW (A) 10/10/2024 2231   APPEARANCEUR CLEAR (A) 10/10/2024 2231   LABSPEC 1.006 10/10/2024 2231   PHURINE 5.0 10/10/2024 2231   GLUCOSEU NEGATIVE 10/10/2024 2231   HGBUR LARGE (A) 10/10/2024 2231   BILIRUBINUR NEGATIVE 10/10/2024 2231   KETONESUR NEGATIVE 10/10/2024 2231   PROTEINUR NEGATIVE 10/10/2024 2231   NITRITE NEGATIVE 10/10/2024 2231   LEUKOCYTESUR NEGATIVE 10/10/2024 2231   Sepsis Labs Recent Labs  Lab 10/10/24 1342  WBC 5.9   Microbiology Recent Results (from the past 240 hours)  Resp panel by RT-PCR (RSV, Flu A&B, Covid) Anterior Nasal Swab     Status: None   Collection Time: 10/10/24  1:50 PM   Specimen: Anterior Nasal Swab  Result Value Ref Range Status   SARS Coronavirus 2 by RT PCR NEGATIVE NEGATIVE Final    Comment: (NOTE)  SARS-CoV-2 target nucleic acids are NOT DETECTED.  The SARS-CoV-2 RNA is generally detectable in upper respiratory specimens during the acute phase of infection. The lowest concentration of SARS-CoV-2 viral copies this assay can detect is 138 copies/mL. A negative result does not preclude SARS-Cov-2 infection and should not be used as the sole basis for treatment or other patient management decisions. A negative result may occur with  improper specimen collection/handling, submission of specimen other than nasopharyngeal swab, presence of viral mutation(s) within the areas targeted by this assay, and inadequate number of viral copies(<138 copies/mL). A negative result must be combined with clinical observations, patient history, and epidemiological information. The expected result is Negative.  Fact Sheet for Patients:  bloggercourse.com  Fact Sheet for Healthcare Providers:  seriousbroker.it  This test is no  t yet approved or cleared by the [place]  FDA and  has been authorized for detection and/or diagnosis of SARS-CoV-2 by FDA under an Emergency Use Authorization (EUA). This EUA will remain  in effect (meaning this test can be used) for the duration of the COVID-19 declaration under Section 564(b)(1) of the Act, 21 U.S.C.section 360bbb-3(b)(1), unless the authorization is terminated  or revoked sooner.       Influenza A by PCR NEGATIVE NEGATIVE Final   Influenza B by PCR NEGATIVE NEGATIVE Final    Comment: (NOTE) The Xpert Xpress SARS-CoV-2/FLU/RSV plus assay is intended as an aid in the diagnosis of influenza from Nasopharyngeal swab specimens and should not be used as a sole basis for treatment. Nasal washings and aspirates are unacceptable for Xpert Xpress SARS-CoV-2/FLU/RSV testing.  Fact Sheet for Patients: bloggercourse.com  Fact Sheet for Healthcare Providers: seriousbroker.it  This test is not yet approved or cleared by the [place]  FDA and has been authorized for detection and/or diagnosis of SARS-CoV-2 by FDA under an Emergency Use Authorization (EUA). This EUA will remain in effect (meaning this test can be used) for the duration of the COVID-19 declaration under Section 564(b)(1) of the Act, 21 U.S.C. section 360bbb-3(b)(1), unless the authorization is terminated or revoked.     Resp Syncytial Virus by PCR NEGATIVE NEGATIVE Final    Comment: (NOTE) Fact Sheet for Patients: bloggercourse.com  Fact Sheet for Healthcare Providers: seriousbroker.it  This test is not yet approved or cleared by the [place]  FDA and has been authorized for detection and/or diagnosis of SARS-CoV-2 by FDA under an Emergency Use Authorization (EUA). This EUA will remain in effect (meaning this test can be used) for the duration of the COVID-19 declaration under Section  564(b)(1) of the Act, 21 U.S.C. section 360bbb-3(b)(1), unless the authorization is terminated or revoked.  Performed at [facility], [address] [place], [place] [address]      Time coordinating discharge: 40 minutes  SIGNED:   Calvin KATHEE Robson, MD  [facility] 10/13/2024, 1:54 PM Pager   If 7PM-7AM, please contact night-coverage     [1]  Allergies Allergen Reactions   Penicillin G Itching, Shortness Of Breath and Swelling   Penicillins Other (See Comments)    unk   Ceftriaxone  Swelling    Upper lip swelled up after ceftriaxone  infusion, no tongue swelling or throat swelling

## 2024-10-13 NOTE — TOC Transition Note (Signed)
 Transition of Care [facility]) - Discharge Note   Patient Details  Name: Bruce Zavala MRN: [id number] Date of Birth: [birth year]  Transition of Care [facility]) CM/SW Contact:  Tirzah Fross L Kyler Germer, LCSW Phone Number: 10/13/2024, 3:30 PM   Clinical Narrative:     Referral for outpatient clinic sent to [facility]. A review of choice was attempted. Daughter voicemail box was full and she was not responding to efforts made.   No further TOC needs identified. TOC signing off.   Final next level of care: Home/Self Care Barriers to Discharge: Continued Medical Work up   Patient Goals and CMS Choice            Discharge Placement                       Discharge Plan and Services Additional resources added to the After Visit Summary for                                       Social Drivers of Health (SDOH) Interventions SDOH Screenings   Food Insecurity: No Food Insecurity (08/24/2024)   Received from [facility]  Housing: Low Risk  (08/24/2024)   Received from [facility]  Transportation Needs: No Transportation Needs (08/24/2024)   Received from [facility]  Utilities: Not At Risk (08/24/2024)   Received from [facility]  Depression [phone]): Low Risk (06/24/2024)  Financial Resource Strain: Low Risk  (08/24/2024)   Received from [facility]  Tobacco Use: Unknown (10/10/2024)     Readmission Risk Interventions     No data to display

## 2024-10-20 NOTE — Progress Notes (Unsigned)
" °  Cardiology Office Note   Date:  10/21/2024  ID:  Bruce Zavala 09/10/1926, MRN [id number] PCP: Kristina Tinnie [name]   HeartCare Providers Cardiologist:  Caron Poser, MD     History of Present Illness Bruce Zavala is a 89 y.o. male PMH HTN, PAF who presents for further evaluation management of atrial fibrillation.  Patient recently admitted to [facility] 10/10/2024 through 10/13/2024.  He presented with acute decompensated heart failure.  Found to be in atrial fibrillation.  He was treated with diuresis and rate control and atrial fibrillation was paroxysmal while hospitalized.  Anticoagulation was started.  TSH normal.  proBNP 2900.  Patient's daughter reports that he has had significant dyspnea since discharge.  He also has orthopnea.  She also reports that he has significant balance issues as well as hematuria.  She notes that she is very worried about him that he may fall anytime she has to leave the house.  Relevant CVD History -TTE 09/2024 LVEF 50 to 55%, normal RV size and function, mild MR, mild AI   ROS: Pt denies any chest discomfort, jaw pain, arm pain, palpitations, syncope, presyncope or LE edema.  Studies Reviewed I have independently reviewed the patient's ECG, previous cardiac testing, recent medical records, recent blood work.  Physical Exam VS:  BP 120/72 (BP Location: Left Arm, Patient Position: Sitting, Cuff Size: Normal)   Pulse (!) 55   Ht 5' 5 (1.651 m)   Wt 163 lb (73.9 kg)   SpO2 100%   BMI 27.12 kg/m        Wt Readings from Last 3 Encounters:  10/21/24 163 lb (73.9 kg)  10/10/24 135 lb (61.2 kg)  06/24/24 164 lb (74.4 kg)    GEN: No acute distress. NECK: JVP to mandible; No carotid bruits. CARDIAC: Irregular rate and rhythm, no murmurs, rubs, gallops. RESPIRATORY: Bibasilar Rales EXTREMITIES:  Warm and well-perfused. No edema.  ASSESSMENT AND PLAN Persistent atrial fibrillation Patient presents with persistent atrial  fibrillation that is slow.  His rates have been reportedly very labile ranging from the low 50s all the way to the mid 100s.  Patient's daughter reports concerns over significant imbalance issues as well as hematuria.  We had a long shared decision making discussion today regarding the risks of stroke versus risk of bleeding with atrial fibrillation and anticoagulation.  At the conclusion of our discussion, we came to an agreement that given his significant balance issues and hematuria, his bleeding risk outweighs his stroke risk.  Therefore, decision was made to stop Eliquis .  Plan: - As above, discontinue Eliquis  given advanced age, significant balance issues, and ongoing hematuria.  Will check CBC. - Will pursue rate control only for now given inability to anticoagulate and labile heart rates - Given significant bradycardia as well as heart rate lability, we will decrease metoprolol  to metoprolol  to tartrate 12.5 mg twice daily - Will obtain monitor to get a better sense of his mean heart rate as well as range.  He may have underlying tachybradycardia syndrome.  HFpEF Decompensated.  He has Rales on exam as well as elevated JVP.  Per report, he has significant orthopnea and DOE.  Plan: - Start Lasix  20 mg daily - Check BMP - Return to clinic in approximately 1 to 2 weeks to assess response to diuresis       Dispo: RTC 2 weeks to assess diuretic response and make decision on ongoing therapy  Signed, Caron Poser, MD  "

## 2024-10-21 ENCOUNTER — Ambulatory Visit

## 2024-10-21 VITALS — BP 120/72 | HR 55 | Ht 65.0 in | Wt 163.0 lb

## 2024-10-21 DIAGNOSIS — Z79899 Other long term (current) drug therapy: Secondary | ICD-10-CM | POA: Diagnosis present

## 2024-10-21 DIAGNOSIS — I5033 Acute on chronic diastolic (congestive) heart failure: Secondary | ICD-10-CM | POA: Diagnosis present

## 2024-10-21 DIAGNOSIS — I5032 Chronic diastolic (congestive) heart failure: Secondary | ICD-10-CM | POA: Insufficient documentation

## 2024-10-21 DIAGNOSIS — I4819 Other persistent atrial fibrillation: Secondary | ICD-10-CM | POA: Insufficient documentation

## 2024-10-21 DIAGNOSIS — I480 Paroxysmal atrial fibrillation: Secondary | ICD-10-CM

## 2024-10-21 MED ORDER — METOPROLOL TARTRATE 25 MG PO TABS: 12.5000 mg | ORAL_TABLET | Freq: Two times a day (BID) | ORAL | 0 refills | Status: DC

## 2024-10-21 MED ORDER — FUROSEMIDE 20 MG PO TABS: 20.0000 mg | ORAL_TABLET | Freq: Every day | ORAL | 3 refills | Status: AC

## 2024-10-21 NOTE — Patient Instructions (Addendum)
 Medication Instructions:  Your physician recommends the following medication changes.  STOP TAKING:  Eliquis  (due to high risk of bleeding)  START TAKING:  Furosemide  (LASIX ) 20 mg once daily  DECREASE:  Metoprolol  Tartrate from 25 mg to 12.5 mg  twice daily  *If you need a refill on your cardiac medications before your next appointment, please call your pharmacy*  Lab Work:  Your provider would like for you to have following labs drawn today BMP, CBC.    If you have labs (blood work) drawn today and your tests are completely normal, you will receive your results only by: MyChart Message (if you have MyChart) OR A paper copy in the mail If you have any lab test that is abnormal or we need to change your treatment, we will call you to review the results.  Testing/Procedures: GEOFFRY HEWS- Long Term Monitor Instructions  Your physician has requested you wear a ZIO patch monitor for 14 days.  This is a single patch monitor. [employer] supplies one patch monitor per enrollment. Additional stickers are not available. Please do not apply patch if you will be having a Nuclear Stress Test, Echocardiogram, Cardiac CT, MRI, or Chest Xray during the period you would be wearing the monitor. The patch cannot be worn during these tests. You cannot remove and re-apply the ZIO XT patch monitor.  Your ZIO patch monitor will be mailed 3 day USPS to your address on file. It may take 3-5 days to receive your monitor after you have been enrolled. Once you have received your monitor, please review the enclosed instructions. Your monitor has already been registered assigning a specific monitor serial number to you.  Billing and Patient Assistance Program Information  We have supplied [employer] with any of your insurance information on file for billing purposes.  [employer] offers a sliding scale Patient Assistance Program for patients that do not have insurance, or whose insurance does not completely cover the cost of  the ZIO monitor.  You must apply for the Patient Assistance Program to qualify for this discounted rate.  To apply, please call [employer] at [phone], select option 4, select option 2, ask to apply for Patient Assistance Program. Meredeth will ask your household income, and how many people are in your household. They will quote your out-of-pocket cost based on that information. [employer] will also be able to set up a 98-month, interest-free payment plan if needed.  Applying the monitor   Shave hair from upper left chest.  Hold abrader disc by orange tab. Rub abrader in 40 strokes over the upper left chest as indicated in your monitor instructions.  Clean area with 4 enclosed alcohol pads. Let dry.  Apply patch as indicated in monitor instructions. Patch will be placed under collarbone on left side of chest with arrow pointing upward.  Rub patch adhesive wings for 2 minutes. Remove white label marked 1. Remove the white label marked 2. Rub patch adhesive wings for 2 additional minutes.  While looking in a mirror, press and release button in center of patch. A small green light will flash 3-4 times. This will be your only indicator that the monitor has been turned on.   After Applying Monitor: Do not shower for the first 24 hours. You may shower after the first 24 hours.  (Keep back toward water) Press the button if you feel a symptom. You will hear a small click. Record Date, Time and Symptom in the Patient Logbook.   After Completing 14 Days: When  you are ready to remove the patch, follow instructions on the last 2 pages of Patient Logbook.  Stick patch monitor into the tabs at the bottom of the return box.  Place Patient Logbook in the blue and white box. Use locking tab on box and tape box closed securely. The blue and white box has prepaid postage on it. Please place it in the mailbox as soon as possible. Your physician should have your test results approximately 7-14 days after the monitor  has been mailed back to [facility].   Troubleshooting: Call [facility] at [phone] if you have questions regarding your ZIO XT patch monitor.  Call them immediately if you see an orange light blinking on your monitor.  If your monitor falls off in less than 4 days, contact our Monitor department at [phone].  If your monitor becomes loose or falls off after 4 days call [employer] at [phone] for suggestions on securing your monitor.   Follow-Up: At [facility], you and your health needs are our priority.  As part of our continuing mission to provide you with exceptional heart care, our providers are all part of one team.  This team includes your primary Cardiologist (physician) and Advanced Practice Providers or APPs (Physician Assistants and Nurse Practitioners) who all work together to provide you with the care you need, when you need it.  Your next appointment:  2 week(s)   (Schedule after 11/11/2024)  Provider:  Caron Poser, MD    We recommend signing up for the patient portal called MyChart.  Sign up information is provided on this After Visit Summary.  MyChart is used to connect with patients for Virtual Visits (Telemedicine).  Patients are able to view lab/test results, encounter notes, upcoming appointments, etc.  Non-urgent messages can be sent to your provider as well.   To learn more about what you can do with MyChart, go to [email or web address].

## 2024-10-22 LAB — SPECIMEN STATUS REPORT

## 2024-10-23 ENCOUNTER — Ambulatory Visit: Payer: Self-pay

## 2024-10-24 LAB — BASIC METABOLIC PANEL WITH GFR
BUN/Creatinine Ratio: 26 — ABNORMAL HIGH (ref 10–24)
BUN: 27 mg/dL (ref 10–36)
CO2: 25 mmol/L (ref 20–29)
Calcium: 9.2 mg/dL (ref 8.6–10.2)
Chloride: 101 mmol/L (ref 96–106)
Creatinine, Ser: 1.03 mg/dL (ref 0.76–1.27)
Glucose: 94 mg/dL (ref 70–99)
Potassium: 4.4 mmol/L (ref 3.5–5.2)
Sodium: 140 mmol/L (ref 134–144)
eGFR: 66 mL/min/1.73

## 2024-10-24 LAB — CBC
Hematocrit: 48.7 % (ref 37.5–51.0)
Hemoglobin: 16 g/dL (ref 13.0–17.7)
MCH: 29.1 pg (ref 26.6–33.0)
MCHC: 32.9 g/dL (ref 31.5–35.7)
MCV: 89 fL (ref 79–97)
Platelets: 283 x10E3/uL (ref 150–450)
RBC: 5.49 x10E6/uL (ref 4.14–5.80)
RDW: 15 % (ref 11.6–15.4)
WBC: 6.5 x10E3/uL (ref 3.4–10.8)

## 2024-10-28 ENCOUNTER — Telehealth: Payer: Self-pay | Admitting: Student

## 2024-10-28 NOTE — Telephone Encounter (Addendum)
" ° °  Patient's daughter [name] called the Answering Service with concerns about patient's breathing.  Called and spoke with [name].  She states that patient had a very short episode where it looked like he was having a hard time breathing.  However, breathing is now back to baseline.  BP was 120/60. O2 94%. HR on pulse oximeter fluctuates between 40s and 90s (he has persistent atrial fibrillation so this may not be accurate). No chest pain, orthopnea, lightheadedness/ dizziness. Daughter states he is back to his recent baseline.  He was recently seen by Dr. Argentina 10/21/2024 after a recent admission for CHF at which time he reported significant dyspnea and was noted to have rales on exam. He was started on Lasix  20mg  daily. Daughter states she has not noticed a significant improvement in breathing but it has not worsened either. She has also not noticed a significant increase in his urine output (not like what he had during recent admission when on IV Lasix ). Recommended he increase Lasix  to 40mg  once daily for the next 2 days to see if that helps some and then can go back to 20mg  once daily (may need to continue higher dose for longer but can see how he does with 2 days of this). Asked daughter to weigh patient every morning and let us  know if he has quick weight gain (3lbs in 1 day or 5lbs in 1 week) which can be a sign of fluid retention. Reviewed ED precautions and explained that I have a low threshold for him to go to the ED if he has any new/ worsening symptoms. Daughter voiced understanding and agreed.   [name], PA-C 10/28/2024 7:23 PM  "

## 2024-11-10 DIAGNOSIS — I480 Paroxysmal atrial fibrillation: Secondary | ICD-10-CM

## 2024-11-10 DIAGNOSIS — I5032 Chronic diastolic (congestive) heart failure: Secondary | ICD-10-CM

## 2024-11-13 NOTE — Progress Notes (Unsigned)
 " Cardiology Office Note   Date:  11/14/2024  ID:  Bruce Zavala Mar 19, 1926, MRN [id number] PCP: Bruce Zavala Bruce Zavala  [facility] Providers Cardiologist:  Caron Poser, MD     History of Present Illness Bruce Zavala is a 89 y.o. male PMH HTN, PAF who presents for further evaluation management of atrial fibrillation.  Patient recently admitted to [facility] 10/10/2024 through 10/13/2024.  He presented with acute decompensated heart failure.  Found to be in atrial fibrillation.  He was treated with diuresis and rate control and atrial fibrillation was paroxysmal while hospitalized.  Anticoagulation was started.  TSH normal.  proBNP 2900.  Patient's daughter reports that he has had significant dyspnea since discharge.  He also has orthopnea.  She also reports that he has significant balance issues as well as hematuria.  She notes that she is very worried about him that he may fall anytime she has to leave the house.  Interval history: Since last visit, we obtained a monitor which showed rate controlled atrial fibrillation. After a long shared-decision making discussion, we had decided to discontinue anticoagulation last visit due to ongoing hematuria and significant balance issues/falls.  We also started him on scheduled Lasix  due to decompensated heart failure.  He has improved somewhat, but still has some dyspnea and orthopnea.  Weights are down about 5 pounds since last visit.  Relevant CVD History -Monitor 10/2024 100% burden atrial fibrillation (mean heart rate 82 bpm, range 39-2 18).  Rare ectopy.  36 episodes NSVT (longest 9 beats). -TTE 09/2024 LVEF 50 to 55%, normal RV size and function, mild MR, mild AI   ROS: Pt denies any chest discomfort, jaw pain, arm pain, palpitations, syncope, presyncope or LE edema.  Studies Reviewed I have independently reviewed the patient's ECG, previous cardiac testing, recent medical records, recent blood work.  Physical Exam VS:   BP (!) 120/50 (BP Location: Left Arm, Patient Position: Sitting)   Pulse 83   Ht 5' 5 (1.651 m)   Wt 158 lb 9.6 oz (71.9 kg)   SpO2 96%   BMI 26.39 kg/m        Wt Readings from Last 3 Encounters:  11/14/24 158 lb 9.6 oz (71.9 kg)  10/21/24 163 lb (73.9 kg)  10/10/24 135 lb (61.2 kg)    GEN: No acute distress. NECK: JVP to mid neck; No carotid bruits. CARDIAC: Irregular rate and rhythm, no murmurs, rubs, gallops. RESPIRATORY: Rales left lung base EXTREMITIES:  Warm and well-perfused. No edema.  ASSESSMENT AND PLAN Permanent atrial fibrillation Patient presents with persistent atrial fibrillation.  At last visit, we had a long shared decision making discussion regarding continuing anticoagulation.  We ultimately decided to stop given his advanced age, significant balance issues and falls, as well as ongoing hematuria.  We obtained a monitor which showed a mean heart rate of 82 without significant pauses.  Since we are unable to anticoagulate and given his advanced age/frailty and lack of rhythm control options, we will call this permanent atrial fibrillation.  Plan: - As above, continue to hold Eliquis  given advanced age/frailty, significant balance issues, and ongoing hematuria.  - Will pursue rate control only for now given inability to anticoagulate and labile heart rates - Consolidate metoprolol  to metoprolol  succinate 25 mg daily; can stop this if needed for bradycardia  HFpEF Better than last visit.  158 pounds down from 163 pounds.  JVP is at the mid neck instead of his mandible.  Still has trace rales  on the left, approaching euvolemia.  Discussed with daughter.  This will be somewhat tricky to manage given his frailty; we certainly do not want to overshoot with diuresis.  Will plan to start an SGLT2 inhibitor today and continue current diuretics.  Plan: - Continue Lasix  20 mg daily - Add Farxiga  10 mg daily - Recheck BMP - Return to clinic in approximately 1 to 2 weeks to  assess response to diuresis       Dispo: RTC 2 weeks to assess diuretic response and make decision on ongoing therapy  Signed, Caron Poser, MD  "

## 2024-11-14 ENCOUNTER — Telehealth: Payer: Self-pay | Admitting: Pharmacy Technician

## 2024-11-14 ENCOUNTER — Ambulatory Visit

## 2024-11-14 ENCOUNTER — Other Ambulatory Visit (HOSPITAL_COMMUNITY): Payer: Self-pay

## 2024-11-14 VITALS — BP 120/50 | HR 83 | Ht 65.0 in | Wt 158.6 lb

## 2024-11-14 DIAGNOSIS — I5033 Acute on chronic diastolic (congestive) heart failure: Secondary | ICD-10-CM | POA: Diagnosis not present

## 2024-11-14 DIAGNOSIS — I4821 Permanent atrial fibrillation: Secondary | ICD-10-CM | POA: Diagnosis not present

## 2024-11-14 DIAGNOSIS — Z79899 Other long term (current) drug therapy: Secondary | ICD-10-CM | POA: Insufficient documentation

## 2024-11-14 MED ORDER — METOPROLOL SUCCINATE ER 25 MG PO TB24: 25.0000 mg | ORAL_TABLET | Freq: Every day | ORAL | 3 refills | Status: DC

## 2024-11-14 MED ORDER — DAPAGLIFLOZIN PROPANEDIOL 10 MG PO TABS: 10.0000 mg | ORAL_TABLET | Freq: Every day | ORAL | 2 refills | Status: DC

## 2024-11-14 NOTE — Patient Instructions (Signed)
 Medication Instructions:  Your physician recommends the following medication changes. \  START TAKING:  Farxiga  10 mg once daily  Metoprolol  Succinate (TOPROL  XL) 25 mg once daily   *If you need a refill on your cardiac medications before your next appointment, please call your pharmacy*  Lab Work: Your provider would like for you to have following labs drawn today BMP.   If you have labs (blood work) drawn today and your tests are completely normal, you will receive your results only by: MyChart Message (if you have MyChart) OR A paper copy in the mail If you have any lab test that is abnormal or we need to change your treatment, we will call you to review the results.  Testing/Procedures: No test ordered today   Follow-Up: At [facility], you and your health needs are our priority.  As part of our continuing mission to provide you with exceptional heart care, our providers are all part of one team.  This team includes your primary Cardiologist (physician) and Advanced Practice Providers or APPs (Physician Assistants and Nurse Practitioners) who all work together to provide you with the care you need, when you need it.  Your next appointment:   2 week(s) to monitor diuresis  Provider:   Caron Poser, MD    We recommend signing up for the patient portal called MyChart.  Sign up information is provided on this After Visit Summary.  MyChart is used to connect with patients for Virtual Visits (Telemedicine).  Patients are able to view lab/test results, encounter notes, upcoming appointments, etc.  Non-urgent messages can be sent to your provider as well.   To learn more about what you can do with MyChart, go to [email or web address].

## 2024-11-14 NOTE — Telephone Encounter (Signed)
 Pharmacy Patient Advocate Encounter   Received notification from CoverMyMeds that prior authorization for dapagliflozin  is required/requested.   Insurance verification completed.   The patient is insured through [employer].   Per test claim: PA required; PA submitted to above mentioned insurance via Latent Key/confirmation #/EOC [id number] Status is pending

## 2024-11-15 ENCOUNTER — Telehealth: Payer: Self-pay

## 2024-11-15 ENCOUNTER — Ambulatory Visit: Payer: Self-pay

## 2024-11-15 ENCOUNTER — Other Ambulatory Visit: Payer: Self-pay | Admitting: *Deleted

## 2024-11-15 ENCOUNTER — Other Ambulatory Visit (HOSPITAL_COMMUNITY): Payer: Self-pay

## 2024-11-15 DIAGNOSIS — I5033 Acute on chronic diastolic (congestive) heart failure: Secondary | ICD-10-CM

## 2024-11-15 LAB — BASIC METABOLIC PANEL WITH GFR
BUN/Creatinine Ratio: 24 (ref 10–24)
BUN: 24 mg/dL (ref 10–36)
CO2: 26 mmol/L (ref 20–29)
Calcium: 9.1 mg/dL (ref 8.6–10.2)
Chloride: 100 mmol/L (ref 96–106)
Creatinine, Ser: 0.99 mg/dL (ref 0.76–1.27)
Glucose: 88 mg/dL (ref 70–99)
Potassium: 4.1 mmol/L (ref 3.5–5.2)
Sodium: 142 mmol/L (ref 134–144)
eGFR: 70 mL/min/1.73

## 2024-11-15 MED ORDER — DAPAGLIFLOZIN PROPANEDIOL 10 MG PO TABS: 10.0000 mg | ORAL_TABLET | Freq: Every day | ORAL | 2 refills | Status: AC

## 2024-11-15 NOTE — Telephone Encounter (Signed)
 Spoke with patient's daughter to clarify metoprolol  prescription, daughter verbalized understanding - also mentioned that the pharmacy did not receive Farxiga  RX - resent to pharmacy

## 2024-11-15 NOTE — Telephone Encounter (Signed)
 Patient's daughter came by the office for clarity on the patient's metoprolol  prescription is to be picked up for the patient. One prescription says metoprolol  succ ER 25 mg tabs and the other says metoprolol  succ 25mg  once daily. [facility] was asking the patient's daughter which one she wants to pick up. Please call the patient's daughter to advise. Thank you

## 2024-11-15 NOTE — Telephone Encounter (Signed)
 Pharmacy Patient Advocate Encounter  Received notification from [facility] MEDICAID that Prior Authorization for Farxiga  has to be Bruce Zavala has been APPROVED from 11/15/24 to 11/15/25. Ran test claim, Copay is $4.00- one month. This test claim was processed through [facility]- copay amounts may vary at other pharmacies due to pharmacy/plan contracts, or as the patient moves through the different stages of their insurance plan.   PA #/Case ID/Reference #:[id number]

## 2024-11-16 ENCOUNTER — Telehealth: Payer: Self-pay | Admitting: Licensed Clinical Social Worker

## 2024-11-16 ENCOUNTER — Other Ambulatory Visit: Payer: Self-pay | Admitting: *Deleted

## 2024-11-16 DIAGNOSIS — I480 Paroxysmal atrial fibrillation: Secondary | ICD-10-CM

## 2024-11-16 DIAGNOSIS — I5033 Acute on chronic diastolic (congestive) heart failure: Secondary | ICD-10-CM

## 2024-11-16 NOTE — Progress Notes (Signed)
 " Heart and Vascular Care Navigation  11/16/2024  [facility] Merrianne Apr 04, 1926 [id number]  Reason for Referral: home assistance Patient is participating in a Managed Medicaid Plan:Yes  Engaged with caregiver (daughter [name]) for initial visit for Heart and Vascular Care Coordination.                                                                                                   Assessment:                                     LCSW able to reach caregiver (pt daughter [name]) at [phone], she declines interpreter at this time. Introduced self, role, reason for call. Pt daughter shares that she is caregiver for her father and mother, she does have children who assist as they are able but she is primary caregiver. Since hospital stays pt has been weaker and pt daughter is concerned about not having assistance to help him when she returns back to work (works part time). We discussed that his Medicaid does have Personal Care Services benefits that he may be able to use. I will send those documents to her to bring to PCP team to complete as we only have cardiac concerns noted on Epic. They can complete and submit to Medicaid for further assessment. Cautioned these benefits do not cover 24/7 assistance, pt daughter states understanding. Otherwise doing okay with food, housing costs and transportation. Will include additional community resources should those be needed moving forward.    HRT/VAS Care Coordination     Patients Home Cardiology Office [facility]   Outpatient Care Team Social Worker   Social Worker Name: Marit Lark, [place], [phone]   Living arrangements for the past 2 months Single Family Home   Lives with: Parents   Patient Current Insurance Coverage Medicaid   Patient Has Concern With Paying Medical Bills No   Does Patient Have Prescription Coverage? Yes   Current home services DME  Walker and a shower chair       Social History:                                                                              SDOH Screenings   Food Insecurity: No Food Insecurity (11/16/2024)  Housing: Low Risk (11/16/2024)  Transportation Needs: No Transportation Needs (11/16/2024)  Utilities: Not At Risk (11/16/2024)  Depression (PHQ2-9): Low Risk (06/24/2024)  Financial Resource Strain: Low Risk (11/16/2024)  Tobacco Use: Unknown (11/14/2024)  Health Literacy: Adequate Health Literacy (11/16/2024)    SDOH Interventions: Financial Resources:  Financial Strain Interventions: Intervention Not Indicated DSS for financial assistance  Food Insecurity:  Food Insecurity Interventions: Intervention Not Indicated  Housing Insecurity:   Intervention not Indicated  Transportation:  Transportation Interventions: Intervention Not Indicated, Patient Resources Dietitian)     Other Care Navigation Interventions:     Provided Pharmacy assistance resources  No concerns noted at this time  Patient Referred to: PCS through [facility]- psychologist, clinical packet sent   Follow-up plan:   LCSW sent pt my card, PCS forms and older adult community resources packet. Will f/u to ensure these are received and answer any additional questions.     "

## 2024-11-16 NOTE — Progress Notes (Signed)
 [name] requested help with patient.  Her FMLA is about to expire and she must go back to work, but patient cannot stay alone.

## 2024-11-23 ENCOUNTER — Emergency Department

## 2024-11-23 ENCOUNTER — Observation Stay: Admission: EM | Admit: 2024-11-23 | Discharge: 2024-11-25 | Disposition: A | Discharge status: Home Health

## 2024-11-23 ENCOUNTER — Other Ambulatory Visit: Payer: Self-pay

## 2024-11-23 DIAGNOSIS — R1310 Dysphagia, unspecified: Secondary | ICD-10-CM | POA: Diagnosis not present

## 2024-11-23 DIAGNOSIS — R54 Age-related physical debility: Secondary | ICD-10-CM | POA: Insufficient documentation

## 2024-11-23 DIAGNOSIS — E039 Hypothyroidism, unspecified: Secondary | ICD-10-CM | POA: Insufficient documentation

## 2024-11-23 DIAGNOSIS — I110 Hypertensive heart disease with heart failure: Secondary | ICD-10-CM | POA: Insufficient documentation

## 2024-11-23 DIAGNOSIS — I700 Atherosclerosis of aorta: Secondary | ICD-10-CM | POA: Insufficient documentation

## 2024-11-23 DIAGNOSIS — I5032 Chronic diastolic (congestive) heart failure: Secondary | ICD-10-CM | POA: Diagnosis present

## 2024-11-23 DIAGNOSIS — R0602 Shortness of breath: Secondary | ICD-10-CM | POA: Diagnosis present

## 2024-11-23 DIAGNOSIS — R5381 Other malaise: Secondary | ICD-10-CM | POA: Insufficient documentation

## 2024-11-23 DIAGNOSIS — R55 Syncope and collapse: Secondary | ICD-10-CM | POA: Diagnosis not present

## 2024-11-23 DIAGNOSIS — I4729 Other ventricular tachycardia: Secondary | ICD-10-CM | POA: Insufficient documentation

## 2024-11-23 DIAGNOSIS — I4821 Permanent atrial fibrillation: Secondary | ICD-10-CM | POA: Diagnosis present

## 2024-11-23 LAB — BASIC METABOLIC PANEL WITH GFR
Anion gap: 12 (ref 5–15)
BUN: 26 mg/dL — ABNORMAL HIGH (ref 8–23)
CO2: 26 mmol/L (ref 22–32)
Calcium: 9.1 mg/dL (ref 8.9–10.3)
Chloride: 102 mmol/L (ref 98–111)
Creatinine, Ser: 1.07 mg/dL (ref 0.61–1.24)
GFR, Estimated: >60 mL/min
Glucose, Bld: 100 mg/dL — ABNORMAL HIGH (ref 70–99)
Potassium: 3.8 mmol/L (ref 3.5–5.1)
Sodium: 140 mmol/L (ref 135–145)

## 2024-11-23 LAB — CBC
HCT: 49.4 % (ref 39.0–52.0)
Hemoglobin: 16.1 g/dL (ref 13.0–17.0)
MCH: 28.4 pg (ref 26.0–34.0)
MCHC: 32.6 g/dL (ref 30.0–36.0)
MCV: 87.3 fL (ref 80.0–100.0)
Platelets: 214 10*3/uL (ref 150–400)
RBC: 5.66 MIL/uL (ref 4.22–5.81)
RDW: 15.9 % — ABNORMAL HIGH (ref 11.5–15.5)
WBC: 8.4 10*3/uL (ref 4.0–10.5)
nRBC: 0 % (ref 0.0–0.2)

## 2024-11-23 LAB — TROPONIN T, HIGH SENSITIVITY: Troponin T High Sensitivity: 20 ng/L — ABNORMAL HIGH (ref 0–19)

## 2024-11-23 LAB — PRO BRAIN NATRIURETIC PEPTIDE: Pro Brain Natriuretic Peptide: 3072 pg/mL — ABNORMAL HIGH

## 2024-11-23 NOTE — Assessment & Plan Note (Addendum)
 Nonsustained ventricular tachycardia on cardiac monitor 10/2024 Bradyarrhythmia Suspect arrhythmogenic syncope resulting in transient inadequate cerebral perfusion Lower suspicion for cardioembolic event related to A-fib without anticoagulation Recent cardiac monitor with multiple episodes of NSVT, longest 11 Family reports patient was bradycardia to the 30s following the episode Continuous cardiac monitoring Neurologic checks Will hold metoprolol  succinate 25 mg due to concern for bradycardia Cardiology consulted

## 2024-11-23 NOTE — ED Provider Notes (Signed)
 "  [facility] Provider Note    Event Date/Time   First MD Initiated Contact with Patient 11/23/24 2046     (approximate)   History   Shortness of Breath  First nurse note: Pt to ED via POV from home.Pt reports sudden onset of dizziness and some SOB. Crackles/rales in lower bases. Hx of CHF. Pt ambulatory on scene.   98% RA Afib HR 81 134/71 CBG 124    Pt to ED for shob started this afternoon. Denies dizziness. Denies pain. States feeling back to normal now. Hx PNA Pt HOH, making triage difficult   HPI Bruce Zavala is a 89 y.o. male PMH CHF, A-fib, aspiration pneumonia, hypertension, thyroid  disease presents for evaluation of dizziness, shortness of breath - Patient states he has been feeling intermittently short of breath over the past couple days and generalized intermittent fatigue -Family bedside provides collateral.  States patient was using his tablet at around 3 PM this afternoon, they briefly walked out of the room and when they came back and he was slumped back in his chair, pale, unconscious.  This lasted 1-2 minutes.  When he aroused he was slightly confused, said he felt very lightheaded and asked them to place him on the floor.  They moved him to the couch and called an ambulance.  They checked his heart rate during this episode and said it was reading between 36 and 40. -No recurrent episodes since this event - He is taking all of his medications as prescribed.  Was recently started on Farxiga  and had some changes to his metoprolol  regimen.  Last metoprolol  dose yesterday evening. - Family says patient did note some mild chest tightness earlier in the day though patient denies this.  Currently asymptomatic.  Per chart review, patient was admitted 10/10/2024-10/13/2024 for CHF exacerbation and new onset A-fib RVR.  Last seen by cardiology on 11/14/2024, holding Eliquis  given risk of falls.  On metoprolol  succinate 25 mg daily.  Can stop as  needed for bradycardia.  Also noted to have HFpEF, weighed 158 LBS at that time, down from 163.  Continue Lasix  20 mg daily.  Added farxiga  10 mg daily.  Has cardiology follow-up appointment on 11/28/2024.  Patient did recently have a Holter monitor.  Rare PVCs and rare ventricular couplets/triplets.  36 episodes of NSVT, longest 9 beats.     Physical Exam   Triage Vital Signs: ED Triage Vitals  Encounter Vitals Group     BP 11/23/24 1602 (!) 141/90     Girls Systolic BP Percentile --      Girls Diastolic BP Percentile --      Boys Systolic BP Percentile --      Boys Diastolic BP Percentile --      Pulse Rate 11/23/24 1602 75     Resp 11/23/24 1602 20     Temp 11/23/24 1606 97.6 F (36.4 C)     Temp src --      SpO2 11/23/24 1602 97 %     Weight 11/23/24 1602 156 lb 8.4 oz (71 kg)     Height 11/23/24 1602 5' 5 (1.651 m)     Head Circumference --      Peak Flow --      Pain Score 11/23/24 1602 0     Pain Loc --      Pain Education --      Exclude from Growth Chart --     Most recent vital signs: Vitals:  11/23/24 2109 11/23/24 2110  BP: (!) 157/89   Pulse: 84   Resp: 19   Temp:  97.6 F (36.4 C)  SpO2: 95%      General: Awake, no distress.  HEENT: Normocephalic, atraumatic CV:  Good peripheral perfusion.  Regular rate, irregular rhythm, RP 2+.  No lower extremity edema appreciated. Resp:  Normal effort. CTAB Abd:  No distention. Nontender to deep palpation throughout Neuro:  Alert, face symmetric, moving all extremity spontaneously, no focal motor deficit appreciated   ED Results / Procedures / Treatments   Labs (all labs ordered are listed, but only abnormal results are displayed) Labs Reviewed  BASIC METABOLIC PANEL WITH GFR - Abnormal; Notable for the following components:      Result Value   Glucose, Bld 100 (*)    BUN 26 (*)    All other components within normal limits  CBC - Abnormal; Notable for the following components:   RDW 15.9 (*)    All  other components within normal limits  PRO BRAIN NATRIURETIC PEPTIDE - Abnormal; Notable for the following components:   Pro Brain Natriuretic Peptide 3,072.0 (*)    All other components within normal limits  TROPONIN T, HIGH SENSITIVITY - Abnormal; Notable for the following components:   Troponin T High Sensitivity 20 (*)    All other components within normal limits  TROPONIN T, HIGH SENSITIVITY     EKG  See ED course below.   RADIOLOGY Radiology interpreted by myself and radiology report reviewed.  No acute pathology identified.    PROCEDURES:  Critical Care performed: No  Procedures   MEDICATIONS ORDERED IN ED: Medications - No data to display   IMPRESSION / MDM / ASSESSMENT AND PLAN / ED COURSE  I reviewed the triage vital signs and the nursing notes.                              DDX/MDM/AP: Differential diagnosis includes, but is not limited to, high concern for arrhythmia, consider ACS, do not clinically suspect PE at this time.  No clear evidence of heart failure exacerbation.  Considered but doubt underlying electrolyte abnormality, anemia contributing.  No infectious symptoms to suggest underlying infection.  Plan: - Labs - EKG - Chest x-ray - Anticipate admission   Patient's presentation is most consistent with acute presentation with potential threat to life or bodily function.  The patient is on the cardiac monitor to evaluate for evidence of arrhythmia and/or significant heart rate changes.  ED course below.  Patient remains in A-fib here with a rate in the 60s-70s.  Will defer his evening dose of metoprolol  at this time given report of bradycardia earlier this morning and discussed with hospitalist.  Clinical Course as of 11/24/24 0035  Wed Nov 23, 2024  2114 CXR: IMPRESSION: 1. Linear infiltration or atelectasis in the lung bases, improved since the prior study. 2. Cardiac enlargement without vascular congestion or edema.   [MM]  2114 CBC,  BMP reviewed, unremarkable [MM]  2114 Ecg = atrial fibrillation, rate 72, no gross ST elevation or depression, no significant repolarization abnormality, normal axis, normal intervals.  No clear evidence of ischemia no arrhythmia on my interpretation. [MM]  2245 Troponin very mildly elevated, will trend  proBNP at baseline  Hospitalist consult order placed [MM]    Clinical Course User Index [MM] Clarine Ozell LABOR, MD     FINAL CLINICAL IMPRESSION(S) / ED DIAGNOSES   Final  diagnoses:  Syncope, unspecified syncope type     Rx / DC Orders   ED Discharge Orders     None        Note:  This document was prepared using Dragon voice recognition software and may include unintentional dictation errors.   Clarine Ozell LABOR, MD 11/24/24 [phone]  "

## 2024-11-23 NOTE — ED Notes (Signed)
 Unsuccessful phlebotomy attempt, lab contacted

## 2024-11-23 NOTE — ED Triage Notes (Signed)
 Pt to ED for shob started this afternoon. Denies dizziness. Denies pain. States feeling back to normal now. Hx PNA Pt HOH, making triage difficult

## 2024-11-23 NOTE — Assessment & Plan Note (Deleted)
 Recent cardiac monitor with multiple episodes of NSVT, longest 19 Continuous cardiac monitoring Will continue metoprolol  succinate 25 mg daily Cardiology consulted

## 2024-11-23 NOTE — ED Triage Notes (Signed)
 First nurse note: Pt to ED via POV from home.Pt reports sudden onset of dizziness and some SOB. Crackles/rales in lower bases. Hx of CHF. Pt ambulatory on scene.   98% RA Afib HR 81 134/71 CBG 124

## 2024-11-24 ENCOUNTER — Observation Stay (HOSPITAL_BASED_OUTPATIENT_CLINIC_OR_DEPARTMENT_OTHER)
Admit: 2024-11-24 | Discharge: 2024-11-24 | Disposition: A | Discharge status: Home / Self Care | Attending: Cardiovascular Disease | Admitting: Cardiovascular Disease

## 2024-11-24 ENCOUNTER — Observation Stay

## 2024-11-24 DIAGNOSIS — R5381 Other malaise: Secondary | ICD-10-CM | POA: Insufficient documentation

## 2024-11-24 DIAGNOSIS — I5032 Chronic diastolic (congestive) heart failure: Secondary | ICD-10-CM

## 2024-11-24 DIAGNOSIS — R55 Syncope and collapse: Secondary | ICD-10-CM

## 2024-11-24 DIAGNOSIS — I4819 Other persistent atrial fibrillation: Secondary | ICD-10-CM

## 2024-11-24 DIAGNOSIS — E039 Hypothyroidism, unspecified: Secondary | ICD-10-CM

## 2024-11-24 DIAGNOSIS — I4891 Unspecified atrial fibrillation: Secondary | ICD-10-CM

## 2024-11-24 DIAGNOSIS — R1310 Dysphagia, unspecified: Secondary | ICD-10-CM

## 2024-11-24 LAB — URINALYSIS, COMPLETE (UACMP) WITH MICROSCOPIC
Bacteria, UA: NONE SEEN
Bilirubin Urine: NEGATIVE
Glucose, UA: >=500 mg/dL — AB
Ketones, ur: NEGATIVE mg/dL
Leukocytes,Ua: NEGATIVE
Nitrite: NEGATIVE
Protein, ur: NEGATIVE mg/dL
Specific Gravity, Urine: 1.003 — ABNORMAL LOW (ref 1.005–1.030)
Squamous Epithelial / HPF: 0 /HPF (ref 0–5)
pH: 6 (ref 5.0–8.0)

## 2024-11-24 LAB — BASIC METABOLIC PANEL WITH GFR
Anion gap: 11 (ref 5–15)
BUN: 24 mg/dL — ABNORMAL HIGH (ref 8–23)
CO2: 25 mmol/L (ref 22–32)
Calcium: 8.7 mg/dL — ABNORMAL LOW (ref 8.9–10.3)
Chloride: 103 mmol/L (ref 98–111)
Creatinine, Ser: 1.17 mg/dL (ref 0.61–1.24)
GFR, Estimated: 57 mL/min — ABNORMAL LOW
Glucose, Bld: 89 mg/dL (ref 70–99)
Potassium: 3.7 mmol/L (ref 3.5–5.1)
Sodium: 138 mmol/L (ref 135–145)

## 2024-11-24 LAB — ECHOCARDIOGRAM LIMITED
Calc EF: 55.9 %
Height: 65 in
S' Lateral: 3.2 cm
Single Plane A2C EF: 55.7 %
Single Plane A4C EF: 55.4 %
Weight: 2504.43 [oz_av]

## 2024-11-24 LAB — CBG MONITORING, ED: Glucose-Capillary: 97 mg/dL (ref 70–99)

## 2024-11-24 LAB — MAGNESIUM: Magnesium: 2.2 mg/dL (ref 1.7–2.4)

## 2024-11-24 LAB — TROPONIN T, HIGH SENSITIVITY
Troponin T High Sensitivity: 19 ng/L (ref 0–19)
Troponin T High Sensitivity: 20 ng/L — ABNORMAL HIGH (ref 0–19)

## 2024-11-24 MED ORDER — LEVOTHYROXINE SODIUM 88 MCG PO TABS
88.0000 ug | ORAL_TABLET | Freq: Every day | ORAL | Status: DC
  Administered 2024-11-24 – 2024-11-25 (×2): 88 ug via ORAL
  Filled 2024-11-24 (×2): qty 1

## 2024-11-24 MED ORDER — POTASSIUM CHLORIDE 20 MEQ PO PACK
40.0000 meq | PACK | Freq: Once | ORAL | Status: DC
  Filled 2024-11-24: qty 2

## 2024-11-24 MED ORDER — FUROSEMIDE 10 MG/ML IJ SOLN
20.0000 mg | Freq: Once | INTRAMUSCULAR | Status: AC
  Administered 2024-11-24: 20 mg via INTRAVENOUS
  Filled 2024-11-24: qty 4

## 2024-11-24 MED ORDER — ONDANSETRON HCL 4 MG PO TABS: 4.0000 mg | ORAL_TABLET | Freq: Four times a day (QID) | ORAL | Status: DC | PRN

## 2024-11-24 MED ORDER — ACETAMINOPHEN 650 MG RE SUPP: 650.0000 mg | Freq: Four times a day (QID) | RECTAL | Status: DC | PRN

## 2024-11-24 MED ORDER — POTASSIUM CHLORIDE 20 MEQ PO PACK
20.0000 meq | PACK | Freq: Once | ORAL | Status: AC
  Administered 2024-11-24: 20 meq via ORAL

## 2024-11-24 MED ORDER — METOPROLOL SUCCINATE ER 50 MG PO TB24
25.0000 mg | ORAL_TABLET | Freq: Every day | ORAL | Status: DC
  Administered 2024-11-24: 25 mg via ORAL
  Filled 2024-11-24 (×2): qty 1

## 2024-11-24 MED ORDER — FUROSEMIDE 40 MG PO TABS: 20.0000 mg | ORAL_TABLET | Freq: Every day | ORAL | Status: DC

## 2024-11-24 MED ORDER — ENOXAPARIN SODIUM 40 MG/0.4ML IJ SOSY
40.0000 mg | PREFILLED_SYRINGE | INTRAMUSCULAR | Status: DC
  Administered 2024-11-24 – 2024-11-25 (×2): 40 mg via SUBCUTANEOUS
  Filled 2024-11-24 (×2): qty 0.4

## 2024-11-24 MED ORDER — DAPAGLIFLOZIN PROPANEDIOL 10 MG PO TABS
10.0000 mg | ORAL_TABLET | Freq: Every day | ORAL | Status: DC
  Filled 2024-11-24: qty 1

## 2024-11-24 MED ORDER — ONDANSETRON HCL 4 MG/2ML IJ SOLN: 4.0000 mg | Freq: Four times a day (QID) | INTRAMUSCULAR | Status: DC | PRN

## 2024-11-24 MED ORDER — SODIUM CHLORIDE 0.9% FLUSH
3.0000 mL | Freq: Two times a day (BID) | INTRAVENOUS | Status: DC
  Administered 2024-11-24 – 2024-11-25 (×3): 3 mL via INTRAVENOUS

## 2024-11-24 MED ORDER — FUROSEMIDE 40 MG PO TABS
20.0000 mg | ORAL_TABLET | Freq: Every day | ORAL | Status: DC
  Administered 2024-11-25: 20 mg via ORAL
  Filled 2024-11-24: qty 1

## 2024-11-24 MED ORDER — ACETAMINOPHEN 325 MG PO TABS: 650.0000 mg | ORAL_TABLET | Freq: Four times a day (QID) | ORAL | Status: DC | PRN

## 2024-11-24 NOTE — Progress Notes (Signed)
 Same day non billable note  He feels better. No further episodes.  Seen by cardiology - Recommend tele, ECHO, Carotid dopplers.   Work up: Trop-19/20, BNP-3072, BUN-24, Creat-1.17, UA negative  CXR- Bilateral atelectasis with no congestion US  carotids- Less then 50 % stenosis.   HR up with ambulation. Cards recommend monitoring overnight. Will nee therapies at discharge.

## 2024-11-24 NOTE — ED Notes (Signed)
Fall bundle in place

## 2024-11-24 NOTE — Assessment & Plan Note (Signed)
 Continue dysphagia 3 diet

## 2024-11-24 NOTE — Hospital Course (Signed)
 SABRA

## 2024-11-24 NOTE — H&P (Signed)
 " History and Physical    Patient: Bruce Zavala DOB: 09/24/1926 DOA: 11/23/2024 DOS: the patient was seen and examined on 11/24/2024 PCP: [name], PA-C  Patient coming from: Home  Chief Complaint:  Chief Complaint  Patient presents with   Shortness of Breath    HPI: Bruce Zavala is a 89 y.o. male with medical history significant for HTN, diastolic CHF(EF 50 to 55% 09/2024), permanent A-fib off anticoagulation due to fall risk, chronic dysphagia on dysphagia 3 diet, hypothyroidism, chronic physical deconditioning, hospitalized a month ago with acute respiratory failure attributed to acute CHF(EF 50 to 55%), , being admitted with a syncopal event that lasted 1 to 2 minutes.   Family reports that they were with him and left the room for a few minutes and upon their return he was seen slumped in a chair and unconscious.  He aroused after a minute or 2 following which he was confused and felt lightheaded and he has not to place him on the floor.  Heart rate recorded at home was 35-40. Of note, patient last saw his cardiologist on 11/14/2024. Patient was apparently experiencing ongoing shortness of breath.  He was continued on his Lasix  20 mg daily and Farxiga  was added.  Metoprolol  was consolidated to Toprol -XL 25 mg daily.   Recent cardiac monitor (10/2024) --100% burden atrial fibrillation (mean heart rate 82 bpm, range 39-218). Rare ectopy. 36 episodes NSVT (longest 9 beats)    By arrival in the ED, patient was back to baseline mentation with normal vitals, BP 141/90 and pulse 75.  Labs with troponin 20 and proBNP 3072.  BMP and CBC unremarkable.  Potassium 3.8, magnesium not done EKG on personal interpretation shows A-fib at 72  Chest x-ray showed linear infiltration or atelectasis lung bases improved from prior study a month ago and cardiac enlargement without vascular congestion or edema.  No medication prescribed Observation requested     Past Medical  History:  Diagnosis Date   Hypertension    Thyroid  disease    History reviewed. No pertinent surgical history. Social History:  reports that he has never smoked. He has never been exposed to tobacco smoke. He does not have any smokeless tobacco history on file. He reports that he does not drink alcohol and does not use drugs.  Allergies[1]  Family History  Problem Relation Age of Onset   Colon cancer Sister     Prior to Admission medications  Medication Sig Start Date End Date Taking? Authorizing Provider  dapagliflozin  propanediol (FARXIGA ) 10 MG TABS tablet Take 1 tablet (10 mg total) by mouth daily before breakfast. 11/14/24   Argentina Clap, MD  furosemide  (LASIX ) 20 MG tablet Take 1 tablet (20 mg total) by mouth daily. 10/21/24 01/19/25  Argentina Clap, MD  levothyroxine  (SYNTHROID ) 88 MCG tablet Take 88 mcg by mouth daily.    [provider]  metoprolol  succinate (TOPROL -XL) 25 MG 24 hr tablet Take 1 tablet (25 mg total) by mouth daily. Take with or immediately following a meal. 11/14/24 10/05/25  Argentina Clap, MD    Physical Exam: Vitals:   11/23/24 2130 11/24/24 0000 11/24/24 0100 11/24/24 0149  BP: (!) 145/87 (!) 144/73 (!) 125/52   Pulse: 70 71 62   Resp: (!) 22 19 14    Temp:    97.6 F (36.4 C)  TempSrc:    Oral  SpO2: 96% 95% 96%   Weight:      Height:       Physical  Exam Vitals and nursing note reviewed.  Constitutional:      General: He is not in acute distress. HENT:     Head: Normocephalic and atraumatic.  Cardiovascular:     Rate and Rhythm: Normal rate and regular rhythm.     Heart sounds: Normal heart sounds.  Pulmonary:     Effort: Pulmonary effort is normal.     Breath sounds: Normal breath sounds.  Abdominal:     Palpations: Abdomen is soft.     Tenderness: There is no abdominal tenderness.  Neurological:     Mental Status: Mental status is at baseline.     Labs on Admission: I have personally reviewed following labs and imaging  studies  CBC: Recent Labs  Lab 11/23/24 1701  WBC 8.4  HGB 16.1  HCT 49.4  MCV 87.3  PLT 214   Basic Metabolic Panel: Recent Labs  Lab 11/23/24 1701  NA 140  K 3.8  CL 102  CO2 26  GLUCOSE 100*  BUN 26*  CREATININE 1.07  CALCIUM 9.1   GFR: Estimated Creatinine Clearance: 35.1 mL/min (by C-G formula based on SCr of 1.07 mg/dL). Liver Function Tests: No results for input(s): AST, ALT, ALKPHOS, BILITOT, PROT, ALBUMIN in the last 168 hours. No results for input(s): LIPASE, AMYLASE in the last 168 hours. No results for input(s): AMMONIA in the last 168 hours. Coagulation Profile: No results for input(s): INR, PROTIME in the last 168 hours. Cardiac Enzymes: No results for input(s): CKTOTAL, CKMB, CKMBINDEX, TROPONINI in the last 168 hours. BNP (last 3 results) Recent Labs    10/10/24 1342 11/23/24 2147  PROBNP 2,919.0* 3,072.0*   HbA1C: No results for input(s): HGBA1C in the last 72 hours. CBG: No results for input(s): GLUCAP in the last 168 hours. Lipid Profile: No results for input(s): CHOL, HDL, LDLCALC, TRIG, CHOLHDL, LDLDIRECT in the last 72 hours. Thyroid  Function Tests: No results for input(s): TSH, T4TOTAL, FREET4, T3FREE, THYROIDAB in the last 72 hours. Anemia Panel: No results for input(s): VITAMINB12, FOLATE, FERRITIN, TIBC, IRON, RETICCTPCT in the last 72 hours. Urine analysis:    Component Value Date/Time   COLORURINE YELLOW (A) 10/10/2024 2231   APPEARANCEUR CLEAR (A) 10/10/2024 2231   LABSPEC 1.006 10/10/2024 2231   PHURINE 5.0 10/10/2024 2231   GLUCOSEU NEGATIVE 10/10/2024 2231   HGBUR LARGE (A) 10/10/2024 2231   BILIRUBINUR NEGATIVE 10/10/2024 2231   KETONESUR NEGATIVE 10/10/2024 2231   PROTEINUR NEGATIVE 10/10/2024 2231   NITRITE NEGATIVE 10/10/2024 2231   LEUKOCYTESUR NEGATIVE 10/10/2024 2231    Radiological Exams on Admission: DG Chest 2 View Result Date:  11/23/2024 EXAM: 2 VIEW(S) XRAY OF THE CHEST 11/23/2024 04:24:00 PM COMPARISON: 10/11/2024 CLINICAL HISTORY: Shortness of breath. FINDINGS: LUNGS AND PLEURA: Inspiration with linear infiltration or atelectasis in the lung bases. No vascular congestion or edema. No pleural effusion. No pneumothorax. This demonstrates improvement since the prior study. HEART AND MEDIASTINUM: Cardiac enlargement. Calcification of the aorta. BONES AND SOFT TISSUES: Degenerative changes in the spine and shoulders. IMPRESSION: 1. Linear infiltration or atelectasis in the lung bases, improved since the prior study. 2. Cardiac enlargement without vascular congestion or edema. Electronically signed by: Elsie Gravely MD 11/23/2024 04:55 PM EST RP Workstation: [id number]   Data Reviewed for HPI: Relevant notes from primary care and specialist visits, past discharge summaries as available in EHR, including Care Everywhere. Prior diagnostic testing as pertinent to current admission diagnoses Updated medications and problem lists for reconciliation ED course, including vitals, labs, imaging, treatment and  response to treatment Triage notes, nursing and pharmacy notes and ED provider's notes Notable results as noted above in HPI      Assessment and Plan: * Syncope, suspect arrhythmogenic Nonsustained ventricular tachycardia on cardiac monitor 10/2024 Bradyarrhythmia Suspect arrhythmogenic syncope resulting in transient inadequate cerebral perfusion Lower suspicion for cardioembolic event related to A-fib without anticoagulation Recent cardiac monitor with multiple episodes of NSVT, longest 81 Family reports patient was bradycardia to the 30s following the episode Continuous cardiac monitoring Neurologic checks Orthostatic vital signs Will hold metoprolol  succinate 25 mg due to concern for bradycardia Check potassium and magnesium and corrected potassium over 4 and magnesium over 2 Cardiology consulted   Permanent  atrial fibrillation (HCC) Currently rate controlled Holding metoprolol  succinate due to concerns for bradycardia Not currently on anticoagulation due to fall risk and recent hematuria Continue aspirin  Continuous cardiac monitoring  Chronic diastolic CHF (congestive heart failure) (HCC) Clinically euvolemic Continue Lasix  with parameters Holding metoprolol  due to concern for bradycardia Daily weights  Hypothyroidism Continue levothyroxine   Physical deconditioning PT OT and TOC consult  Dysphagia Continue dysphagia 3 diet     DVT prophylaxis: Lovenox   Consults: [facility] cardiology  Advance Care Planning:   Code Status: Prior   Family Communication: none  Disposition Plan: Back to previous home environment  Severity of Illness: The appropriate patient status for this patient is OBSERVATION. Observation status is judged to be reasonable and necessary in order to provide the required intensity of service to ensure the patient's safety. The patient's presenting symptoms, physical exam findings, and initial radiographic and laboratory data in the context of their medical condition is felt to place them at decreased risk for further clinical deterioration. Furthermore, it is anticipated that the patient will be medically stable for discharge from the hospital within 2 midnights of admission.   Author: Delayne LULLA Solian, MD 11/24/2024 1:52 AM  For on call review [email or web address].      [1]  Allergies Allergen Reactions   Penicillin G Itching, Shortness Of Breath and Swelling   Penicillins Other (See Comments)    unk   Ceftriaxone  Swelling    Upper lip swelled up after ceftriaxone  infusion, no tongue swelling or throat swelling   "

## 2024-11-24 NOTE — Evaluation (Signed)
 Occupational Therapy Evaluation Patient Details Name: Bruce Zavala MRN: [id number] DOB: June 25, 1926 Today's Date: 11/24/2024   History of Present Illness   Pt is a 89 y.o. male presents after syncopal event, suspect arrhythmogenic. PMH of HTN, diastolic CHF, permanent A-fib,  chronic dysphagia, hypothyroidism, chronic physical deconditioning, hospitalized a month ago with acute respiratory failure attributed to acute CHF     Clinical Impressions Pt was seen for OT evaluation this date. PTA, pt resides at home with his wife and daughter who provide 24/7 supervision. Pt is mostly indep with ADLs, does have assist for shower transfers and ambulates household distances with a SPC vs furniture walking.   Pt presents with deficits in strength, balance, activity tolerance and cardiopulmonary status limiting their ability to perform ADL management at baseline level. Pt currently requires MOD I for bed mobility, supervision for STS from high ED gurney and CGA/SBA for ADL transfer ~15-20 ft using RW for toileting tasks. Orthostatic vitals taken during session with pt denying any symptoms. He reports 10/20 RPE after session.    BP- Lying Pulse- Lying BP- Sitting Pulse- Sitting BP- Standing at 0 minutes Pulse- Standing at 0 minutes BP-standing at 3 mins Pulse-standing 3 mins  11/24/24 1156 132/82 107 123/58 103 125/61 117 119/75 128  HR max of 155 during session with toileting tasks, but mostly ranging from 115-130 with activity. Will follow acutely to maximize safety and provide education on ECS/pacing. Do anticipate the need for follow up OT services upon acute hospital DC.      If plan is discharge home, recommend the following:   A little help with walking and/or transfers;A little help with bathing/dressing/bathroom;Assistance with cooking/housework;Assist for transportation;Help with stairs or ramp for entrance     Functional Status Assessment   Patient has had a recent decline in their  functional status and demonstrates the ability to make significant improvements in function in a reasonable and predictable amount of time.     Equipment Recommendations   None recommended by OT     Recommendations for Other Services         Precautions/Restrictions   Precautions Precautions: Fall Recall of Precautions/Restrictions: Intact Restrictions Weight Bearing Restrictions Per Provider Order: No     Mobility Bed Mobility Overal bed mobility: Modified Independent                  Transfers Overall transfer level: Needs assistance Equipment used: None Transfers: Sit to/from Stand Sit to Stand: From elevated surface, Supervision           General transfer comment: stood from high ED gurney, no LOB and ambulated to toilet using RW with CGA/SBA      Balance Overall balance assessment: Needs assistance Sitting-balance support: Feet supported Sitting balance-Leahy Scale: Good     Standing balance support: During functional activity, Bilateral upper extremity supported Standing balance-Leahy Scale: Fair Standing balance comment: RW                           ADL either performed or assessed with clinical judgement   ADL Overall ADL's : Needs assistance/impaired                         Toilet Transfer: Contact guard assist;Rolling walker (2 wheels)   Toileting- Clothing Manipulation and Hygiene: Supervision/safety;Sit to/from stand       Functional mobility during ADLs: Contact guard assist;Rolling walker (2 wheels);Supervision/safety General ADL Comments:  ADL transfer from bed to toilet in ED room using RW with CGA/SBA, no LOB HR up to 155 with activity but jumping around a lot from 80s-130 with activity     Vision Patient Visual Report: No change from baseline       Perception         Praxis         Pertinent Vitals/Pain Pain Assessment Pain Assessment: No/denies pain     Extremity/Trunk Assessment Upper  Extremity Assessment Upper Extremity Assessment: Overall WFL for tasks assessed   Lower Extremity Assessment Lower Extremity Assessment: Overall WFL for tasks assessed;Generalized weakness       Communication Communication Communication: Impaired Factors Affecting Communication: Hearing impaired;Non - English speaking, interpreter not available (dtr interpreted session)   Cognition Arousal: Alert Behavior During Therapy: WFL for tasks assessed/performed                                         Cueing  General Comments   Cueing Techniques: Verbal cues;Tactile cues  HR varying a lot during session around 80s-100s at rest and up to 155 max during session with toileting tasks most around 115-133 with activity   Exercises Other Exercises Other Exercises: Edu on role of OT in acute setting and DC recommendations.   Shoulder Instructions      Home Living Family/patient expects to be discharged to:: Private residence Living Arrangements: Children;Spouse/significant other (daughter and son in law) Available Help at Discharge: Family;Available 24 hours/day Type of Home: House Home Access: Level entry     Home Layout: One level     Bathroom Shower/Tub: Chief Strategy Officer: Standard Bathroom Accessibility: Yes   Home Equipment: Agricultural Consultant (2 wheels);Cane - single point;Shower seat;BSC/3in1          Prior Functioning/Environment Prior Level of Function : Independent/Modified Independent             Mobility Comments: Pt uses SPC and also furniture walks, household amb; uses RW to go outside to the car to MD appts, etc. ADLs Comments: mostly indep with ADLs, son in law assists with showering to get in tub to sit on chair    OT Problem List: Decreased strength;Decreased activity tolerance;Decreased cognition;Impaired balance (sitting and/or standing);Decreased safety awareness   OT Treatment/Interventions: Self-care/ADL  training;Balance training;Therapeutic exercise;Therapeutic activities;Energy conservation;Manual therapy;Patient/family education      OT Goals(Current goals can be found in the care plan section)   Acute Rehab OT Goals Patient Stated Goal: go home OT Goal Formulation: With patient/family Time For Goal Achievement: 12/08/24 Potential to Achieve Goals: Fair ADL Goals Pt Will Perform Lower Body Dressing: with modified independence;sitting/lateral leans;sit to/from stand;with set-up Pt Will Transfer to Toilet: with supervision;ambulating Additional ADL Goal #1: Pt will demo implementation of 1 learned ECS during ADL performance 2/2 trials to prevent overexertion and maximize safety/indep with ADLs.   OT Frequency:  Min 2X/week    Co-evaluation              AM-PAC OT 6 Clicks Daily Activity     Outcome Measure Help from another person eating meals?: None Help from another person taking care of personal grooming?: A Little Help from another person toileting, which includes using toliet, bedpan, or urinal?: A Little Help from another person bathing (including washing, rinsing, drying)?: A Little Help from another person to put on and taking off regular upper body  clothing?: None Help from another person to put on and taking off regular lower body clothing?: A Little 6 Click Score: 20   End of Session Equipment Utilized During Treatment: Rolling walker (2 wheels) Nurse Communication: Mobility status  Activity Tolerance: Patient tolerated treatment well Patient left: in bed;with call bell/phone within reach;with family/visitor present;with bed alarm set  OT Visit Diagnosis: Unsteadiness on feet (R26.81);Repeated falls (R29.6);Muscle weakness (generalized) (M62.81)                Time: 9156-9089 OT Time Calculation (min): 27 min Charges:  OT General Charges $OT Visit: 1 Visit OT Evaluation $OT Eval Moderate Complexity: 1 Mod OT Treatments $Self Care/Home Management : 8-22  mins Bruce Zavala, OTR/L  11/24/24, 12:06 PM  Bruce Zavala 11/24/2024, 12:02 PM

## 2024-11-24 NOTE — Assessment & Plan Note (Signed)
 PT OT and TOC consult

## 2024-11-24 NOTE — Discharge Instructions (Signed)
 Personal Care Services (PCS) About PCS Personal Care Services [facility]) provides personal care services to individuals residing in a:  Private living arrangement Residential facility licensed by [place]  as an adult care home Combination home as defined in [place] 131[place]-101[place]1a). Group home licensed under Chapter 122C of the General Statutes and under 10A NCAC 27G.5601 as a supervised living facility for two or more adults whose primary diagnosis is mental illness, a developmental disability or substance abuse dependency Eligibility These services benefit individuals who require assistance with activities of daily living (ADLs), including:  Eating Dressing Bathing Toileting Mobility To qualify for PCS, an individual must have a medical condition, disability or cognitive impairment, and demonstrates unmet needs for:  Three of the five ADLs with limited hands-on assistance Two ADLs, one of which requires extensive assistance Two ADLs, one of which requires assistance at the full dependence level PCS program eligibility is determined by an independent assessment conducted by [facility] Medicaid or its designee, and is provided according to an individualized service plan.   Apply at your local Social Services Agency-[place] DSS    Heres a summary of [facility] and key contact info you can use for referrals or follow-up, especially relevant if youre evaluating in-home support options:  ?? What [facility] Is  [facility] is a nationwide non-medical home care provider focused on helping older adults and adults with disabilities live safely and comfortably in their homes. Their services are personalized and typically include:  Personal care (assistance with grooming, bathing, dressing)  Companionship  Homemaking support (light housekeeping, meal prep, errands)  Respite care for family caregivers  Flexible scheduling (from short visits to live-in care) They  emphasize customized care plans rather than one-size-fits-all programs and support families throughout the care coordination process. [facility] has been in the home care industry for decades and features a chiropractor where local offices tailor services to community needs.  ?? Local Offices & Contact Info ([place] )  [place] / [place]  [facility]) -- In-home caregiving services for [place] and surrounding communities ?? [phone] ?? [email or web address]  [facility]) -- Home care services in the [place] area ?? [phone] ?? [email or web address]  [place] / [place]  [facility] ([place]) -- Services around [place] and nearby communities ?? [phone] ?? [email or web address]  [employer] [facility] [place]) ?? [phone] ?? [email or web address]  [facility]) ?? [phone] ?? [email or web address]  [place] / [employer] End  [facility] ([place] / [place]) ?? [phone] ?? [email or web address]  [facility] [place]) ?? [phone] ?? [email or web address]

## 2024-11-24 NOTE — Assessment & Plan Note (Signed)
 Clinically euvolemic Continue Lasix  with parameters Holding metoprolol  due to concern for bradycardia Daily weights

## 2024-11-24 NOTE — Evaluation (Signed)
 Physical Therapy Evaluation Patient Details Name: Bruce Zavala MRN: [id number] DOB: 06-12-1926 Today's Date: 11/24/2024  History of Present Illness  Bruce Zavala is a 89yoM who [facility] ED with SOB, dizziness.  Clinical Impression  Pt in bed, DTR at bedside. Pt reports to feel generally well, is able to move essentially at baseline for basic mobility needs, but does have elevated HR, RR, and DOE after 144ft AMB, far beyond what is typical at baseline. Vital signs not stable with walking, but pt denies any dizziness or CP. Recommend HHPT at DC.       If plan is discharge home, recommend the following: A little help with walking and/or transfers;Assistance with cooking/housework;Help with stairs or ramp for entrance;Assist for transportation;A little help with bathing/dressing/bathroom   Can travel by private vehicle        Equipment Recommendations None recommended by PT  Recommendations for Other Services       Functional Status Assessment Patient has had a recent decline in their functional status and demonstrates the ability to make significant improvements in function in a reasonable and predictable amount of time.     Precautions / Restrictions Precautions Precautions: Fall Restrictions Weight Bearing Restrictions Per Provider Order: No      Mobility  Bed Mobility Overal bed mobility: Modified Independent Bed Mobility: Supine to Sit, Sit to Supine     Supine to sit: Modified independent (Device/Increase time) Sit to supine: Modified independent (Device/Increase time)        Transfers Overall transfer level: Needs assistance Equipment used: None   Sit to Stand: From elevated surface           General transfer comment: ED gurney height, no LOB    Ambulation/Gait Ambulation/Gait assistance: Contact guard assist Gait Distance (Feet): 150 Feet Assistive device: None Gait Pattern/deviations: Step-to pattern Gait velocity: <0.21m/s     General  Gait Details: HR in 140s-150s RVR by end of walk, SOB adn RR elevated by back in room, resolves in <2 minutes. No CP, no dizziness.  Stairs            Wheelchair Mobility     Tilt Bed    Modified Rankin (Stroke Patients Only)       Balance                                             Pertinent Vitals/Pain Pain Assessment Pain Assessment: No/denies pain    Home Living Family/patient expects to be discharged to:: Private residence Living Arrangements: Children;Spouse/significant other (daughter and son in law) Available Help at Discharge: Family;Available 24 hours/day Type of Home: House Home Access: Level entry       Home Layout: One level Home Equipment: Agricultural Consultant (2 wheels);Cane - single point;Shower seat;BSC/3in1      Prior Function Prior Level of Function : Independent/Modified Independent             Mobility Comments: Pt uses SPC and also furniture walks, household amb; uses RW to go outside to the car to MD appts, etc. ADLs Comments: mostly indep with ADLs, son in law assists with showering to get in tub to sit on chair     Extremity/Trunk Assessment                Communication   Communication Communication: (P) Impaired Factors Affecting Communication: (P) Hearing impaired;Non - English  speaking, interpreter not available (daughter interprets)    Cognition                                         Cueing       General Comments      Exercises     Assessment/Plan    PT Assessment Patient needs continued PT services  PT Problem List Decreased activity tolerance;Decreased balance;Decreased mobility;Decreased safety awareness;Cardiopulmonary status limiting activity       PT Treatment Interventions DME instruction;Gait training;Stair training;Functional mobility training;Therapeutic activities;Therapeutic exercise;Balance training;Patient/family education    PT Goals (Current goals can be  found in the Care Plan section)  Acute Rehab PT Goals Patient Stated Goal: regain baseline mobility tolerance PT Goal Formulation: With patient Time For Goal Achievement: 12/08/24 Potential to Achieve Goals: Good    Frequency Min 2X/week     Co-evaluation               AM-PAC PT 6 Clicks Mobility  Outcome Measure Help needed turning from your back to your side while in a flat bed without using bedrails?: None Help needed moving from lying on your back to sitting on the side of a flat bed without using bedrails?: None Help needed moving to and from a bed to a chair (including a wheelchair)?: A Little Help needed standing up from a chair using your arms (e.g., wheelchair or bedside chair)?: A Little Help needed to walk in hospital room?: A Little Help needed climbing 3-5 steps with a railing? : A Little 6 Click Score: 20    End of Session   Activity Tolerance: Patient tolerated treatment well;Treatment limited secondary to medical complications (Comment) Patient left: in bed;with call bell/phone within reach;with family/visitor present   PT Visit Diagnosis: Other abnormalities of gait and mobility (R26.89)    Time: 8966-8951 PT Time Calculation (min) (ACUTE ONLY): 15 min   Charges:   PT Evaluation $PT Eval Low Complexity: 1 Low   PT General Charges $$ ACUTE PT VISIT: 1 Visit       11:22 AM, 11/24/24 Peggye JAYSON Linear, PT, DPT Physical Therapist - [facility]  [phone] (ASCOM)    [name] 11/24/2024, 11:20 AM

## 2024-11-24 NOTE — Assessment & Plan Note (Signed)
 Continue levothyroxine 

## 2024-11-24 NOTE — Consult Note (Signed)
 "  Cardiology Consultation   Patient ID: Bruce Zavala MRN: [id number]; DOB: 1926/03/03  Admit date: 11/23/2024 Date of Consult: 11/24/2024  PCP:  [name], PA-C   [facility] Providers Cardiologist:  Caron Poser, MD     Patient Profile: [name] is a 89 y.o. male with a hx of permanent afibrillation not on anticoagulation due to fall risk, HFpEF, hypertension, chronic dysphagia, hypothyroidism, chronic physical deconditioning who is being seen 11/24/2024 for the evaluation of syncope at the request of Dr. Ludie.  History of Present Illness: [name] is followed by Dr. Poser for the above cardiac issues.  He was admitted 12/15-12/18 with acute decompensated heart failure with a BNP of 2900 and permanent A-fib.  He was diuresed and rate controlled.  Echo showed EF 50 to 55%, normal RV size and function, mild MR, mild AI.  Anticoagulation was started.  Subsequent heart monitor showed 100% A-fib burden with a range of 39-218bpm, average 82 bpm, 36 episodes of nonsustained VT longest lasting 9 beats with a maximal heart rate of 218 bpm.  The patient was started on Lasix .  He was seen in the office 11/14/2024.  A long discussion regarding anticoagulation was had.  It was decided to stop anticoagulation due to advanced age and significant balance issues and ongoing hematuria.  Patient presented to the ER 11/23/2024 for syncope. Patient reports he started feeling SOB around lunch time. The patient does not remember passing out. Family reports they were with him and left the room for a few minutes and upon their return he was seen slumped over in the chair and unconscious.  He came to after 1 to 2 minutes. He felt lightheaded and confused.  EMS was called and it was noted heart rate was in the 30s.  Daughter reported he had slight chest pain after getting to the hospital. Patient denies chest pain, palpitations. Patient was taking lasix  daily and daughter felt  volume status was good.   In the ER blood pressure 141/90 and pulse 75 bpm.  proBNP 3072.  Potassium 3.8, serum creatinine 1.07, BUN 26.  EKG showed A-fib with a heart rate in the 70s.  High-sensitivity troponin 20, 19, 20.  Magnesium 2.2.CXR showed linear infiltration or atelectasis in the lung bases, improved since the prior study, cardiac enlargement without vascular congestion or edema. Toprol  was held and he was admitted for further work-up.   Past Medical History:  Diagnosis Date   Hypertension    Thyroid  disease     History reviewed. No pertinent surgical history.   Home Medications:  Prior to Admission medications  Medication Sig Start Date End Date Taking? Authorizing Provider  dapagliflozin  propanediol (FARXIGA ) 10 MG TABS tablet Take 1 tablet (10 mg total) by mouth daily before breakfast. 11/14/24  Yes Poser Caron, MD  furosemide  (LASIX ) 20 MG tablet Take 1 tablet (20 mg total) by mouth daily. 10/21/24 01/19/25 Yes Poser Caron, MD  levothyroxine  (SYNTHROID ) 88 MCG tablet Take 88 mcg by mouth daily.   Yes [provider]  metoprolol  succinate (TOPROL -XL) 25 MG 24 hr tablet Take 1 tablet (25 mg total) by mouth daily. Take with or immediately following a meal. 11/14/24 10/05/25 Yes Poser Caron, MD    Scheduled Meds:  dapagliflozin  propanediol  10 mg Oral QAC breakfast   enoxaparin  (LOVENOX ) injection  40 mg Subcutaneous Q24H   furosemide   20 mg Oral Daily   levothyroxine   88 mcg Oral Q0600   potassium chloride   40 mEq Oral Once   sodium chloride  flush  3 mL Intravenous Q12H   Continuous Infusions:  PRN Meds: acetaminophen  **OR** acetaminophen , ondansetron  **OR** ondansetron  (ZOFRAN ) IV  Allergies:   Allergies[1]  Social History:   Social History   Socioeconomic History   Marital status: Widowed    Spouse name: Not on file   Number of children: Not on file   Years of education: Not on file   Highest education level: Not on file  Occupational  History   Not on file  Tobacco Use   Smoking status: Never    Passive exposure: Never   Smokeless tobacco: Not on file  Vaping Use   Vaping status: Never Used  Substance and Sexual Activity   Alcohol use: Never   Drug use: Never   Sexual activity: Not Currently  Other Topics Concern   Not on file  Social History Narrative   Not on file   Social Drivers of Health   Tobacco Use: Unknown (11/23/2024)   Patient History    Smoking Tobacco Use: Never    Smokeless Tobacco Use: Unknown    Passive Exposure: Never  Financial Resource Strain: Low Risk (11/16/2024)   Overall Financial Resource Strain (CARDIA)    Difficulty of Paying Living Expenses: Not very hard  Food Insecurity: No Food Insecurity (11/16/2024)   Epic    Worried About Radiation Protection Practitioner of Food in the Last Year: Never true    Ran Out of Food in the Last Year: Never true  Transportation Needs: No Transportation Needs (11/16/2024)   Epic    Lack of Transportation (Medical): No    Lack of Transportation (Non-Medical): No  Physical Activity: Not on file  Stress: Not on file  Social Connections: Not on file  Intimate Partner Violence: Not on file  Depression (PHQ2-9): Low Risk (06/24/2024)   Depression (PHQ2-9)    PHQ-2 Score: 0  Alcohol Screen: Not on file  Housing: Low Risk (11/16/2024)   Epic    Unable to Pay for Housing in the Last Year: No    Number of Times Moved in the Last Year: 0    Homeless in the Last Year: No  Utilities: Not At Risk (11/16/2024)   Epic    Threatened with loss of utilities: No  Health Literacy: Adequate Health Literacy (11/16/2024)   B1300 Health Literacy    Frequency of need for help with medical instructions: Rarely    Family History:   Family History  Problem Relation Age of Onset   Colon cancer Sister      ROS:  Please see the history of present illness.  All other ROS reviewed and negative.     Physical Exam/Data: Vitals:   11/24/24 0400 11/24/24 0500 11/24/24 0559 11/24/24 0600   BP: (!) 156/75 133/75  134/65  Pulse: (!) 115 72  67  Resp: 15 14  17   Temp:   97.7 F (36.5 C)   TempSrc:   Oral   SpO2: 94% 94%  94%  Weight:      Height:        Intake/Output Summary (Last 24 hours) at 11/24/2024 0745 Last data filed at 11/24/2024 0225 Gross per 24 hour  Intake 10 ml  Output --  Net 10 ml      11/23/2024    4:02 PM 11/14/2024   10:43 AM 10/21/2024    3:00 PM  Last 3 Weights  Weight (lbs) 156 lb 8.4 oz 158 lb 9.6 oz 163 lb  Weight (  kg) 71 kg 71.94 kg 73.936 kg     Body mass index is 26.05 kg/m.  General:  Well nourished, well developed, in no acute distress HEENT: normal Neck: no JVD Vascular: No carotid bruits; Distal pulses 2+ bilaterally Cardiac:  normal S1, S2; Irreg Irreg; no murmur  Lungs: rales on the left base  Abd: soft, nontender, no hepatomegaly  Ext: no edema Musculoskeletal:  No deformities, BUE and BLE strength normal and equal Skin: warm and dry  Neuro:  CNs 2-12 intact, no focal abnormalities noted Psych:  Normal affect   EKG:  The EKG was personally reviewed and demonstrates:  Afib 72bpm, no changes Telemetry:  Telemetry was personally reviewed and demonstrates:  Afib HR 70-80s, PVCs, 3 beats NSVT  Relevant CV Studies:  Heart monitor 10/2024 [facility] Merrianne had a 14-day Zio monitor which was abnormal.   The monitor revealed 100% burden of atrial fibrillation with mean HR of 82 bpm (range 39 - 218).    There were rare PVCs and rare ventricular couplets/triplets.   No patient triggers were recorded.   There were 36 episodes of NSVT (longest 9 beats, max HR 218bpm).  Echo 09/2024  1. TDS.   2. Left ventricular ejection fraction, by estimation, is 50 to 55%. The  left ventricle has low normal function. The left ventricle has no regional  wall motion abnormalities. Left ventricular diastolic function could not  be evaluated.   3. Right ventricular systolic function is normal. The right ventricular  size is normal.   4. The  mitral valve is grossly normal. Mild mitral valve regurgitation.   5. The aortic valve is calcified. Aortic valve regurgitation is mild.  Aortic valve sclerosis/calcification is present, without any evidence of  aortic stenosis.   Laboratory Data: High Sensitivity Troponin:  No results for input(s): TROPONINIHS in the last 720 hours.  Recent Labs  Lab 11/23/24 2147 11/24/24 0031 11/24/24 0138  TRNPT 20* 19 20*      Chemistry Recent Labs  Lab 11/23/24 1701 11/24/24 0138 11/24/24 0612  NA 140  --  138  K 3.8  --  3.7  CL 102  --  103  CO2 26  --  25  GLUCOSE 100*  --  89  BUN 26*  --  24*  CREATININE 1.07  --  1.17  CALCIUM 9.1  --  8.7*  MG  --  2.2  --   GFRNONAA >60  --  57*  ANIONGAP 12  --  11    No results for input(s): PROT, ALBUMIN, AST, ALT, ALKPHOS, BILITOT in the last 168 hours. Lipids No results for input(s): CHOL, TRIG, HDL, LABVLDL, LDLCALC, CHOLHDL in the last 168 hours.  Hematology Recent Labs  Lab 11/23/24 1701  WBC 8.4  RBC 5.66  HGB 16.1  HCT 49.4  MCV 87.3  MCH 28.4  MCHC 32.6  RDW 15.9*  PLT 214   Thyroid  No results for input(s): TSH, FREET4 in the last 168 hours.  BNP Recent Labs  Lab 11/23/24 2147  PROBNP 3,072.0*    DDimer No results for input(s): DDIMER in the last 168 hours.  Radiology/Studies:  DG Chest 2 View Result Date: 11/23/2024 EXAM: 2 VIEW(S) XRAY OF THE CHEST 11/23/2024 04:24:00 PM COMPARISON: 10/11/2024 CLINICAL HISTORY: Shortness of breath. FINDINGS: LUNGS AND PLEURA: Inspiration with linear infiltration or atelectasis in the lung bases. No vascular congestion or edema. No pleural effusion. No pneumothorax. This demonstrates improvement since the prior study. HEART AND MEDIASTINUM: Cardiac  enlargement. Calcification of the aorta. BONES AND SOFT TISSUES: Degenerative changes in the spine and shoulders. IMPRESSION: 1. Linear infiltration or atelectasis in the lung bases, improved since the  prior study. 2. Cardiac enlargement without vascular congestion or edema. Electronically signed by: Elsie Gravely MD 11/23/2024 04:55 PM EST RP Workstation: [id number]    Assessment and Plan:  Syncope - patient found to be unconscious while sitting in a chair. Suspected he was out 1-2 minutes. Family noted HR in the 30s. He takes Toprol  for perm Afib. He is not on a/c due to fall risk  - In the ER HR in the 70s, concern for arrhythmogenic syncope - HS trop almost normal (20), proBNP elevated - orthostatics ordered - recent echo showed LVEF 50-55%, mild MR. We will repeat a limited echo - recent heart monitor showed 100% Afi burden, average Hr 86bpm, 36 runs of NSVT. May need to repeat at d/c>>will discuss with MD.  - Mag, K and TSH Ok - check carotid US  - check UA for UTI  Permanent Afib - metoprolol  held for concerns for bradycardia - tele shows Afib HR 70-90s with occasional PVCs, 3 beats NSVT - not on a/c due to advanced age and fall risk - may not need BB therapy, we will continue to monitor  Chronic diastolic CHF - proBNP 3072 and CXR unremarkable - PTA lasix  and Farxiga  - does not appear volume up on exam although proBNP Was elevated - will hold farxiga  until UA comes back - consider low dose IV lasix   Hypothyroidism - was normal last month - per IM   For questions or updates, please contact [facility] Please consult [email or web address] for contact info under      Signed, Aasiyah Auerbach VEAR Fishman, PA-C  11/24/2024 7:45 AM     [1]  Allergies Allergen Reactions   Penicillin G Itching, Shortness Of Breath and Swelling   Penicillins Other (See Comments)    unk   Ceftriaxone  Swelling    Upper lip swelled up after ceftriaxone  infusion, no tongue swelling or throat swelling   "

## 2024-11-24 NOTE — Assessment & Plan Note (Signed)
 Currently rate controlled Holding metoprolol  succinate due to concerns for bradycardia Not currently on anticoagulation due to fall risk and recent hematuria Continue aspirin  Continuous cardiac monitoring

## 2024-11-24 NOTE — TOC Initial Note (Signed)
 Transition of Care [facility]) - Initial/Assessment Note    Patient Details  Name: Bruce Zavala MRN: [id number] Date of Birth: 25-May-1926  Transition of Care [facility]) CM/SW Contact:    Eboney Claybrook L Jinnie Onley, LCSW Phone Number: 11/24/2024, 10:42 AM  Clinical Narrative:                  CSW and RNCM met with patient. Daughter was at bedside. Daughter speaks and is fluent in English.   CSW advised that the overall impression is that patient will need SNF. Daughter declined and advised that patient is discharging home. She advised that she lives with both of her parents. She inquired about assistance in the home. CSW advised of private care options for the home. Patient has medicaid and would not qualify for [facility].   CSW advised that daughter will need to go to Social Services to inquire about PCS services to assist in the home. Information will be added to the AVS.   Information regarding Private pay options for home care will also be added.   Per attending, patient is not medically ready for discharge. Requests for home care sent in the hub.        Patient Goals and CMS Choice            Expected Discharge Plan and Services                                              Prior Living Arrangements/Services                       Activities of Daily Living      Permission Sought/Granted                  Emotional Assessment              Admission diagnosis:  Syncope [R55] Patient Active Problem List   Diagnosis Date Noted   Dysphagia 11/24/2024   Physical deconditioning 11/24/2024   Hypothyroidism 11/24/2024   Syncope, suspect arrhythmogenic 11/23/2024   Non-sustained ventricular tachycardia -on cardiac monitor 10/2024 (HCC) 11/23/2024   Acute decompensated heart failure (HCC) 10/11/2024   Aspiration pneumonia (HCC) 10/10/2024   A-fib (HCC) 10/10/2024   Chronic diastolic CHF (congestive heart failure) (HCC) 10/10/2024   Permanent  atrial fibrillation (HCC) 10/10/2024   PCP:  [name], PA-C Pharmacy:   [phone], [place] - [address] [place] [address] Phone: [phone] Fax: [phone]     Social Drivers of Health (SDOH) Social History: SDOH Screenings   Food Insecurity: No Food Insecurity (11/16/2024)  Housing: Low Risk (11/16/2024)  Transportation Needs: No Transportation Needs (11/16/2024)  Utilities: Not At Risk (11/16/2024)  Depression (PHQ2-9): Low Risk (06/24/2024)  Financial Resource Strain: Low Risk (11/16/2024)  Tobacco Use: Unknown (11/23/2024)  Health Literacy: Adequate Health Literacy (11/16/2024)   SDOH Interventions:     Readmission Risk Interventions     No data to display

## 2024-11-25 DIAGNOSIS — I4729 Other ventricular tachycardia: Secondary | ICD-10-CM

## 2024-11-25 DIAGNOSIS — I5032 Chronic diastolic (congestive) heart failure: Secondary | ICD-10-CM | POA: Diagnosis not present

## 2024-11-25 DIAGNOSIS — I4821 Permanent atrial fibrillation: Secondary | ICD-10-CM | POA: Diagnosis not present

## 2024-11-25 DIAGNOSIS — R55 Syncope and collapse: Secondary | ICD-10-CM | POA: Diagnosis not present

## 2024-11-25 LAB — THYROID PANEL WITH TSH
Free Thyroxine Index: 3.3 (ref 1.2–4.9)
T3 Uptake Ratio: 33 % (ref 24–39)
T4, Total: 9.9 ug/dL (ref 4.5–12.0)
TSH: 3.87 u[IU]/mL (ref 0.450–4.500)

## 2024-11-25 LAB — CBC
HCT: 45.8 % (ref 39.0–52.0)
Hemoglobin: 15.6 g/dL (ref 13.0–17.0)
MCH: 28.9 pg (ref 26.0–34.0)
MCHC: 34.1 g/dL (ref 30.0–36.0)
MCV: 85 fL (ref 80.0–100.0)
Platelets: 204 10*3/uL (ref 150–400)
RBC: 5.39 MIL/uL (ref 4.22–5.81)
RDW: 15.9 % — ABNORMAL HIGH (ref 11.5–15.5)
WBC: 5.8 10*3/uL (ref 4.0–10.5)
nRBC: 0 % (ref 0.0–0.2)

## 2024-11-25 LAB — BASIC METABOLIC PANEL WITH GFR
Anion gap: 9 (ref 5–15)
BUN: 28 mg/dL — ABNORMAL HIGH (ref 8–23)
CO2: 26 mmol/L (ref 22–32)
Calcium: 8.6 mg/dL — ABNORMAL LOW (ref 8.9–10.3)
Chloride: 98 mmol/L (ref 98–111)
Creatinine, Ser: 1.58 mg/dL — ABNORMAL HIGH (ref 0.61–1.24)
GFR, Estimated: 40 mL/min — ABNORMAL LOW
Glucose, Bld: 119 mg/dL — ABNORMAL HIGH (ref 70–99)
Potassium: 3.4 mmol/L — ABNORMAL LOW (ref 3.5–5.1)
Sodium: 133 mmol/L — ABNORMAL LOW (ref 135–145)

## 2024-11-25 LAB — CBG MONITORING, ED: Glucose-Capillary: 99 mg/dL (ref 70–99)

## 2024-11-25 MED ORDER — DAPAGLIFLOZIN PROPANEDIOL 10 MG PO TABS
10.0000 mg | ORAL_TABLET | Freq: Every day | ORAL | Status: DC
  Administered 2024-11-25: 10 mg via ORAL
  Filled 2024-11-25: qty 1

## 2024-11-25 NOTE — Hospital Course (Signed)
 89 y.o. male with medical history significant for HTN, diastolic CHF(EF 50 to 55% 09/2024), permanent A-fib off anticoagulation due to fall risk, chronic dysphagia on dysphagia 3 diet, hypothyroidism, chronic physical deconditioning, hospitalized a month ago with acute respiratory failure attributed to acute CHF(EF 50 to 55%) admitted with a syncopal event that lasted 1 to 2 minutes.  Recent cardiac monitor showed 100% burden of atrial fibrillation.  He was also complaining of shortness of breath.  Work up: Trop-19/20, BNP-3072, BUN-24, Creat-1.17, UA negative, TSH-3.8 CXR- Bilateral atelectasis with no congestion US  carotids- Less then 50 % stenosis.  Echo showed preserved EF 50 to 55%.  He was observed overnight with no further syncopal episodes.  He remained in atrial fibrillation with rates up to 110/min with ambulation.  He did receive 1 dose of Lasix  IV with improvement in his shortness of breath.  He did not had any other concerns.  Per cardiology his metoprolol  and Farxiga  were discontinued.  He should follow-up with his cardiologist next week as arranged for further monitoring and possibly placement of event monitor.  He was cleared for discharge from cardiology.  He was discharged home with home therapy based on PT recommendation.    Discussed plan of care with patient and daughter at the bedside and answered all questions to satisfaction.

## 2024-11-25 NOTE — Progress Notes (Signed)
 Physical Therapy Treatment Patient Details Name: Bruce Zavala MRN: [id number] DOB: January 23, 1926 Today's Date: 11/25/2024   History of Present Illness Bruce Zavala is a 89yoM who comes to [facility] ED with SOB, dizziness.    PT Comments  Pt in ED still, C pod today, cariology report meds have been inittiated for rate control, this is supported by vitals during AMB today. Pt remains in AF with RVR but rates in 120s/1230s for less than 5 seconds total over 37ft AMB, mostly in 110s. Pt tolerates twice the distance compared to previous day, has no post AMB DOE. Pt continues to deny dizziness. Pt left at EOB with meal tray presented. DTR in room throughout. Will continue to follow.    If plan is discharge home, recommend the following: A little help with walking and/or transfers;Assistance with cooking/housework;Help with stairs or ramp for entrance;Assist for transportation;A little help with bathing/dressing/bathroom   Can travel by private vehicle        Equipment Recommendations  None recommended by PT    Recommendations for Other Services       Precautions / Restrictions Precautions Precautions: Fall Recall of Precautions/Restrictions: Intact Restrictions Weight Bearing Restrictions Per Provider Order: No     Mobility  Bed Mobility Overal bed mobility: Modified Independent Bed Mobility: Supine to Sit     Supine to sit: Modified independent (Device/Increase time)          Transfers Overall transfer level: Needs assistance Equipment used: None Transfers: Sit to/from Stand Sit to Stand: Independent           General transfer comment: normal bed, no LOB, adjusts belt and hjeans ad lib    Ambulation/Gait   Gait Distance (Feet): 300 Feet Assistive device: Rolling walker (2 wheels) Gait Pattern/deviations: Step-through pattern       General Gait Details: HR predominately in 110s, with a few brief rates in 120s, 130s; has no sustained elevation at end of walk (as  previous day) no DOE as previous day   Comptroller Bed    Modified Rankin (Stroke Patients Only)       Balance                                            Communication    Cognition Arousal: Alert Behavior During Therapy: WFL for tasks assessed/performed   PT - Cognitive impairments: No apparent impairments                                Cueing    Exercises      General Comments        Pertinent Vitals/Pain Pain Assessment Pain Assessment: No/denies pain    Home Living                          Prior Function            PT Goals (current goals can now be found in the care plan section) Acute Rehab PT Goals Patient Stated Goal: regain baseline mobility tolerance Time For Goal Achievement: 12/08/24 Potential to Achieve Goals: Good Progress towards PT goals: Progressing toward goals    Frequency    Min 2X/week  PT Plan      Co-evaluation              AM-PAC PT 6 Clicks Mobility   Outcome Measure  Help needed turning from your back to your side while in a flat bed without using bedrails?: None Help needed moving from lying on your back to sitting on the side of a flat bed without using bedrails?: None Help needed moving to and from a bed to a chair (including a wheelchair)?: A Little Help needed standing up from a chair using your arms (e.g., wheelchair or bedside chair)?: A Little Help needed to walk in hospital room?: A Little Help needed climbing 3-5 steps with a railing? : A Little 6 Click Score: 20    End of Session Equipment Utilized During Treatment: Gait belt Activity Tolerance: Patient tolerated treatment well;Treatment limited secondary to medical complications (Comment) Patient left: in bed;with call bell/phone within reach;with family/visitor present Nurse Communication: Mobility status PT Visit Diagnosis: Other abnormalities of gait and  mobility (R26.89)     Time: 9144-9090 PT Time Calculation (min) (ACUTE ONLY): 14 min  Charges:    $Therapeutic Activity: 8-22 mins PT General Charges $$ ACUTE PT VISIT: 1 Visit                    9:25 AM, 11/25/24 Peggye JAYSON Linear, PT, DPT Physical Therapist - [facility]  [phone] (ASCOM)     Koehn Salehi C 11/25/2024, 9:23 AM

## 2024-11-25 NOTE — Progress Notes (Signed)
 "  Progress Note  Patient Name: Bruce Zavala Date of Encounter: 11/25/2024 [facility] Cardiologist: Caron Poser, MD   Interval Summary    Patient is overall feeling better. Still in afib with improved HR.   Vital Signs Vitals:   11/25/24 0100 11/25/24 0149 11/25/24 0400 11/25/24 0500  BP: 135/77 135/77 110/76 110/76  Pulse:  88  88  Resp: 17 17 (!) 21 (!) 21  Temp:  98 F (36.7 C)  97.9 F (36.6 C)  TempSrc:  Oral  Oral  SpO2: 95% 95% 96% 96%  Weight:      Height:        Intake/Output Summary (Last 24 hours) at 11/25/2024 0747 Last data filed at 11/24/2024 2336 Gross per 24 hour  Intake --  Output 275 ml  Net -275 ml      11/23/2024    4:02 PM 11/14/2024   10:43 AM 10/21/2024    3:00 PM  Last 3 Weights  Weight (lbs) 156 lb 8.4 oz 158 lb 9.6 oz 163 lb  Weight (kg) 71 kg 71.94 kg 73.936 kg      Telemetry/ECG  Afib Hr 70-80s, 4 beats NSVT, up to 120 when ambulating - Personally Reviewed  Physical Exam  GEN: No acute distress.   Neck: No JVD Cardiac: RRR, no murmurs, rubs, or gallops.  Respiratory: Clear to auscultation bilaterally. GI: Soft, nontender, non-distended  MS: No edema  Relevant CV Studies:   Heart monitor 10/2024 [facility] Zavala had a 14-day Zio monitor which was abnormal.   The monitor revealed 100% burden of atrial fibrillation with mean HR of 82 bpm (range 39 - 218).    There were rare PVCs and rare ventricular couplets/triplets.   No patient triggers were recorded.   There were 36 episodes of NSVT (longest 9 beats, max HR 218bpm).   Echo 09/2024  1. TDS.   2. Left ventricular ejection fraction, by estimation, is 50 to 55%. The  left ventricle has low normal function. The left ventricle has no regional  wall motion abnormalities. Left ventricular diastolic function could not  be evaluated.   3. Right ventricular systolic function is normal. The right ventricular  size is normal.   4. The mitral valve is grossly normal.  Mild mitral valve regurgitation.   5. The aortic valve is calcified. Aortic valve regurgitation is mild.  Aortic valve sclerosis/calcification is present, without any evidence of  aortic stenosis.   Patient Profile: Bruce Zavala is a 89 y.o. male with a hx of permanent afibrillation not on anticoagulation due to fall risk, HFpEF, hypertension, chronic dysphagia, hypothyroidism, chronic physical deconditioning who is being seen 11/24/2024 for the evaluation of syncope at the request of Dr. Ludie.  Assessment & Plan   Syncope - patient found to be unconscious while sitting in a chair. Suspected he was out 1-2 minutes. Family noted HR in the 30s although no strips availeable. He was on Toprol  for perm Afib. He is not on a/c due to fall risk  - In the ER HR in the 70s, concern for arrhythmogenic syncope - HS trop almost normal (20), proBNP elevated - orthostatics negative -  repeat limited echo LVEF 50-55%, no WMA - recent heart monitor showed 100% Afib burden, average Hr 86bpm, 36 runs of NSVT.  - Mag, K and TSH Ok - carotid US  with less than 50% stenosis bilaterally - UA negative for UTI - will need repeat heart monitor as OP   Permanent Afib -  metoprolol  held for concerns for bradycardia - tele shows Afib HR 70-90s with occasional PVCs, 4 beats NSVT - HR up to 120s with exertion - not on a/c due to advanced age and fall risk - plan to hold BB and revisit after heart monitor   Chronic diastolic CHF - proBNP 3072 and CXR unremarkable - PTA lasix  and Farxiga  - does not appear volume up on exam although proBNP Was elevated - continue to hold Farxiga  - s/p IV lasix  20mg  once   Hypothyroidism - labs normal last month - per IM     For questions or updates, please contact [facility] Please consult [email or web address] for contact info under         Signed, Jessenya Berdan VEAR Fishman, PA-C   "

## 2024-11-28 ENCOUNTER — Ambulatory Visit

## 2024-11-28 ENCOUNTER — Telehealth: Payer: Self-pay | Admitting: Licensed Clinical Social Worker

## 2024-11-28 VITALS — BP 106/58 | HR 65 | Ht 65.0 in | Wt 150.0 lb

## 2024-11-28 DIAGNOSIS — I5032 Chronic diastolic (congestive) heart failure: Secondary | ICD-10-CM | POA: Diagnosis not present

## 2024-11-28 DIAGNOSIS — I4821 Permanent atrial fibrillation: Secondary | ICD-10-CM | POA: Diagnosis not present

## 2024-11-28 DIAGNOSIS — Z79899 Other long term (current) drug therapy: Secondary | ICD-10-CM | POA: Diagnosis not present

## 2024-11-28 DIAGNOSIS — R55 Syncope and collapse: Secondary | ICD-10-CM | POA: Diagnosis not present

## 2024-11-28 NOTE — Progress Notes (Signed)
 " Cardiology Office Note   Date:  11/28/2024  ID:  Fredderick Helling Merrianne RANKS 1926/02/05, MRN [id number] PCP: Kristina Tinnie [name]  [facility] Providers Cardiologist:  Caron Poser, MD     History of Present Illness Emmerich Cryer Merrianne is a 89 y.o. male PMH HTN, PAF who presents for further evaluation management of atrial fibrillation.  Patient recently admitted to [facility] 10/10/2024 through 10/13/2024.  He presented with acute decompensated heart failure.  Found to be in atrial fibrillation.  He was treated with diuresis and rate control and atrial fibrillation was paroxysmal while hospitalized.  Anticoagulation was started.  TSH normal.  proBNP 2900.  Patient's daughter reports that he has had significant dyspnea since discharge.  He also has orthopnea.  She also reports that he has significant balance issues as well as hematuria.  She notes that she is very worried about him that he may fall anytime she has to leave the house.  Interval history: Since last visit, he was unfortunately hospitalized following a syncopal event.  There were reports of significant bradycardia during the event, so we ended up holding his metoprolol .  Also held his Jardiance in case this was contributory.  Since discharge, no issues.  Breathing is better.  No more syncopal events.  Daughter notes that patient overall looks better.  Relevant CVD History -Carotid Dopplers 10/2024 less than 50% stenosis bilaterally -Limited TTE 11/24/2024 LVEF 50 to 55%, no significant valvular disease -Monitor 10/2024 100% burden atrial fibrillation (mean heart rate 82 bpm, range 39-2 18).  Rare ectopy.  36 episodes NSVT (longest 9 beats). -TTE 09/2024 LVEF 50 to 55%, normal RV size and function, mild MR, mild AI   ROS: Pt denies any chest discomfort, jaw pain, arm pain, palpitations, syncope, presyncope or LE edema.  Studies Reviewed I have independently reviewed the patient's ECG, previous cardiac testing, recent medical  records, recent blood work.  Physical Exam VS:  BP (!) 106/58 (BP Location: Left Arm, Patient Position: Sitting, Cuff Size: Normal)   Pulse 65   Ht 5' 5 (1.651 m)   Wt 150 lb (68 kg)   SpO2 97%   BMI 24.96 kg/m        Wt Readings from Last 3 Encounters:  11/28/24 150 lb (68 kg)  11/23/24 156 lb 8.4 oz (71 kg)  11/14/24 158 lb 9.6 oz (71.9 kg)    GEN: No acute distress. NECK: JVP at clavicle; No carotid bruits. CARDIAC: Irregular rate and rhythm, no murmurs, rubs, gallops. RESPIRATORY: Rales left lung base EXTREMITIES:  Warm and well-perfused. No edema.  ASSESSMENT AND PLAN Syncope and collapse Unclear etiology.  Given his advanced age, would be most concern for medication.  He almost certainly has some underlying AV nodal dysfunction, so given report of heart rate in the 30s following event, he may have underlying tachybradycardia syndrome and intolerance of beta-blockers.  Was also taking Jardiance and Lasix , so possible that dehydration could have contributed.  Very challenging to manage medications given his age/frailty.  Plan: - Will obtain repeat monitor off of beta-blockers to assess mean heart rate; will also be able to see if he is having any conversion pauses, though this was not seen on the last monitor obtained - Continue to hold Jardiance for now  Permanent atrial fibrillation At a prior visit, we had a long shared decision making discussion regarding continuing anticoagulation.  We ultimately decided to stop given his advanced age, significant balance issues and falls, as well as ongoing hematuria.  We obtained a monitor which showed a mean heart rate of 82 without significant pauses.  Since we are unable to anticoagulate and given his advanced age/frailty and lack of rhythm control options, we will call this permanent atrial fibrillation.  Plan: - As above, continue to hold Eliquis  given advanced age/frailty, significant balance issues, and ongoing hematuria.  - As  above, will stop the metoprolol  for now given concerns for bradycardia and syncope.  We will get a repeat monitor to see what his mean heart rate is off of rate control agents.  HFpEF Euvolemic today.  No rales.  JVP at clavicle.  We have held his Farxiga  given recent syncopal episode.  I think we will continue to do this given lack of data of SGLT2 inhibitors in this population.  Plan: - Continue Lasix  20 mg daily - Hold Farxiga  as above; can consider restarting in the future, but would be hesitant to do so given recent syncopal episode and lack of data for SGLT2 inhibitors in 96 year olds        Dispo: RTC 3 months or sooner if needed  Signed, Caron Poser, MD  "

## 2024-11-28 NOTE — Telephone Encounter (Signed)
 H&V Care Navigation CSW Progress Note  Clinical Social Worker contacted caregiver by phone to f/u on patient assistance for home care services. No answer at daughters number (speaks English, only listed number). Left message for [name] at [phone], will re-attempt again as able.  Patient is participating in a Managed Medicaid Plan:  Yes- [employer]  SDOH Screenings   Food Insecurity: No Food Insecurity (11/16/2024)  Housing: Low Risk (11/16/2024)  Transportation Needs: No Transportation Needs (11/16/2024)  Utilities: Not At Risk (11/16/2024)  Depression (PHQ2-9): Low Risk (06/24/2024)  Financial Resource Strain: Low Risk (11/16/2024)  Tobacco Use: Unknown (11/28/2024)  Health Literacy: Adequate Health Literacy (11/16/2024)    Marit Lark, MSW, LCSW Clinical Social Worker II [facility] Heart/Vascular Care Navigation  [phone]- work cell phone (preferred)

## 2024-12-01 ENCOUNTER — Telehealth: Payer: Self-pay

## 2024-12-01 NOTE — Telephone Encounter (Signed)
 Daughter is calling to see if its okay that she give the patient some cough meds. She states he has a real bad cough. Please advise

## 2024-12-02 ENCOUNTER — Telehealth: Payer: Self-pay | Admitting: Licensed Clinical Social Worker

## 2024-12-02 NOTE — Telephone Encounter (Addendum)
 H&V Care Navigation CSW Progress Note  Clinical Social Worker contacted caregiver by phone to f/u on in home aide services. Reached pt daughter at  [phone], DPR on file and pt daughter speaks English. She confirmed she received community resources and has contacted several agencies on the list. She does not think they received paperwork to bring to PCP team for [facility], if she doesn't receive anything by Monday I will re-attempt to send it or send to PCP office to complete. Pt daughter agreeable, we will touch base Monday.   Patient is participating in a Managed Medicaid Plan:  Yes- [employer]  SDOH Screenings   Food Insecurity: No Food Insecurity (11/16/2024)  Housing: Low Risk (11/16/2024)  Transportation Needs: No Transportation Needs (11/16/2024)  Utilities: Not At Risk (11/16/2024)  Depression (PHQ2-9): Low Risk (06/24/2024)  Financial Resource Strain: Low Risk (11/16/2024)  Tobacco Use: Unknown (11/28/2024)  Health Literacy: Adequate Health Literacy (11/16/2024)    Marit Lark, MSW, LCSW Clinical Social Worker II [facility] Heart/Vascular Care Navigation  [phone]- work cell phone (preferred)

## 2024-12-23 ENCOUNTER — Ambulatory Visit: Admitting: Physician Assistant

## 2025-02-27 ENCOUNTER — Ambulatory Visit

## 2025-06-29 ENCOUNTER — Encounter: Admitting: Physician Assistant
